# Patient Record
Sex: Male | Born: 1945
Health system: Southern US, Community
[De-identification: ages and names within clinical notes are randomized; demographics above are authoritative.]

## PROBLEM LIST (undated history)

## (undated) DIAGNOSIS — I10 Essential (primary) hypertension: Secondary | ICD-10-CM

## (undated) DIAGNOSIS — I2583 Coronary atherosclerosis due to lipid rich plaque: Secondary | ICD-10-CM

## (undated) DIAGNOSIS — I2510 Atherosclerotic heart disease of native coronary artery without angina pectoris: Secondary | ICD-10-CM

## (undated) DIAGNOSIS — E782 Mixed hyperlipidemia: Secondary | ICD-10-CM

## (undated) HISTORY — DX: Essential (primary) hypertension: I10

## (undated) HISTORY — DX: Atherosclerotic heart disease of native coronary artery without angina pectoris: I25.10

## (undated) HISTORY — DX: Mixed hyperlipidemia: E78.2

## (undated) HISTORY — DX: Coronary atherosclerosis due to lipid rich plaque: I25.83

---

## 2005-06-15 HISTORY — PX: CAROTID STENT: SHX1301

## 2015-08-12 DIAGNOSIS — E7800 Pure hypercholesterolemia, unspecified: Secondary | ICD-10-CM | POA: Diagnosis not present

## 2015-08-12 DIAGNOSIS — I10 Essential (primary) hypertension: Secondary | ICD-10-CM | POA: Diagnosis not present

## 2015-08-12 DIAGNOSIS — E559 Vitamin D deficiency, unspecified: Secondary | ICD-10-CM | POA: Diagnosis not present

## 2015-08-13 DIAGNOSIS — E782 Mixed hyperlipidemia: Secondary | ICD-10-CM | POA: Diagnosis not present

## 2015-08-13 DIAGNOSIS — I10 Essential (primary) hypertension: Secondary | ICD-10-CM | POA: Diagnosis not present

## 2015-08-13 DIAGNOSIS — I2510 Atherosclerotic heart disease of native coronary artery without angina pectoris: Secondary | ICD-10-CM | POA: Diagnosis not present

## 2015-08-16 DIAGNOSIS — I10 Essential (primary) hypertension: Secondary | ICD-10-CM | POA: Diagnosis not present

## 2015-08-16 DIAGNOSIS — Z0000 Encounter for general adult medical examination without abnormal findings: Secondary | ICD-10-CM | POA: Diagnosis not present

## 2015-08-16 DIAGNOSIS — E7800 Pure hypercholesterolemia, unspecified: Secondary | ICD-10-CM | POA: Diagnosis not present

## 2016-01-31 DIAGNOSIS — Z23 Encounter for immunization: Secondary | ICD-10-CM | POA: Diagnosis not present

## 2016-07-30 DIAGNOSIS — E782 Mixed hyperlipidemia: Secondary | ICD-10-CM | POA: Diagnosis not present

## 2016-07-30 DIAGNOSIS — I10 Essential (primary) hypertension: Secondary | ICD-10-CM | POA: Diagnosis not present

## 2016-07-30 DIAGNOSIS — I2510 Atherosclerotic heart disease of native coronary artery without angina pectoris: Secondary | ICD-10-CM | POA: Diagnosis not present

## 2016-07-30 DIAGNOSIS — I2583 Coronary atherosclerosis due to lipid rich plaque: Secondary | ICD-10-CM | POA: Diagnosis not present

## 2016-08-06 DIAGNOSIS — R7301 Impaired fasting glucose: Secondary | ICD-10-CM | POA: Diagnosis not present

## 2016-08-06 DIAGNOSIS — I10 Essential (primary) hypertension: Secondary | ICD-10-CM | POA: Diagnosis not present

## 2016-08-06 DIAGNOSIS — I2510 Atherosclerotic heart disease of native coronary artery without angina pectoris: Secondary | ICD-10-CM | POA: Diagnosis not present

## 2016-08-06 DIAGNOSIS — E782 Mixed hyperlipidemia: Secondary | ICD-10-CM | POA: Diagnosis not present

## 2016-08-06 DIAGNOSIS — I2583 Coronary atherosclerosis due to lipid rich plaque: Secondary | ICD-10-CM | POA: Diagnosis not present

## 2016-09-01 ENCOUNTER — Ambulatory Visit: Payer: Self-pay | Admitting: Physician Assistant

## 2016-09-10 NOTE — Progress Notes (Deleted)
   Cardiology Office Note    Date:  09/10/2016   ID:  James Hall, DOB 17-Oct-1945, MRN [id number]  PCP:  No primary care provider on file.  Cardiologist:  New- will establish with Dr. Anne FuSkains (wife new Dr)  CC: establish care- previously followed in [place].   History of Present Illness:  James Ponsrwin Moorefield is a 71 y.o. male with a history of HTN, HLD, CAD    Past Medical History:  Diagnosis Date  . Atherosclerotic heart disease of native coronary artery without angina pectoris   . Coronary arteriosclerosis due to lipid rich plaque   . Hypertension   . Mixed hyperlipidemia     Past Surgical History:  Procedure Laterality Date  . CAROTID STENT  2007    Current Medications: Outpatient Medications Prior to Visit  Medication Sig Dispense Refill  . aspirin EC 81 MG tablet Take 81 mg by mouth daily.    Marland Kitchen. atorvastatin (LIPITOR) 80 MG tablet Take 40 mg by mouth daily.    . Azilsartan Medoxomil (EDARBI) 80 MG TABS Take 80 mg by mouth daily.    . chlorthalidone (HYGROTON) 25 MG tablet Take 25 mg by mouth daily.    . clopidogrel (PLAVIX) 75 MG tablet Take 75 mg by mouth daily.     No facility-administered medications prior to visit.      Allergies:   Penicillins   Social History   Social History  . Marital status: N/A    Spouse name: N/A  . Number of children: N/A  . Years of education: N/A   Social History Main Topics  . Smoking status: Former Smoker    Types: Cigarettes    Quit date: 09/02/1972  . Smokeless tobacco: Never Used  . Alcohol use Yes     Comment: RARELY  . Drug use: No  . Sexual activity: Not on file   Other Topics Concern  . Not on file   Social History Narrative  . No narrative on file     Family History:  The patient's ***family history includes Other in his father.      *** ROS/PE    Wt Readings from Last 3 Encounters:  No data found for Wt      Studies/Labs Reviewed:   EKG:  EKG is*** ordered today.  The ekg ordered today  demonstrates ***  Recent Labs: No results found for requested labs within last 8760 hours.   Lipid Panel No results found for: CHOL, TRIG, HDL, CHOLHDL, VLDL, LDLCALC, LDLDIRECT  Additional studies/ records that were reviewed today include:  ***   ASSESSMENT & PLAN:       Medication Adjustments/Labs and Tests Ordered: Current medicines are reviewed at length with the patient today.  Concerns regarding medicines are outlined above.  Medication changes, Labs and Tests ordered today are listed in the Patient Instructions below. There are no Patient Instructions on file for this visit.   Signed, Cline CrockKathryn Thompson, PA-C  09/10/2016 1:10 PM    [facility] [address], [place], [place]  [address] Phone: [phone]) [phone]; Fax: [phone]) [phone]

## 2016-09-11 ENCOUNTER — Ambulatory Visit: Payer: Self-pay | Admitting: Physician Assistant

## 2016-09-25 ENCOUNTER — Encounter: Payer: Self-pay | Admitting: Cardiology

## 2016-10-08 ENCOUNTER — Ambulatory Visit: Payer: Self-pay | Admitting: Physician Assistant

## 2016-10-15 ENCOUNTER — Ambulatory Visit (INDEPENDENT_AMBULATORY_CARE_PROVIDER_SITE_OTHER): Payer: Medicare Other | Admitting: Cardiology

## 2016-10-15 ENCOUNTER — Encounter (INDEPENDENT_AMBULATORY_CARE_PROVIDER_SITE_OTHER): Payer: Self-pay

## 2016-10-15 ENCOUNTER — Encounter: Payer: Self-pay | Admitting: Cardiology

## 2016-10-15 VITALS — BP 162/80 | HR 70 | Ht 67.0 in | Wt 184.4 lb

## 2016-10-15 DIAGNOSIS — I10 Essential (primary) hypertension: Secondary | ICD-10-CM | POA: Insufficient documentation

## 2016-10-15 DIAGNOSIS — E7800 Pure hypercholesterolemia, unspecified: Secondary | ICD-10-CM

## 2016-10-15 DIAGNOSIS — I2510 Atherosclerotic heart disease of native coronary artery without angina pectoris: Secondary | ICD-10-CM | POA: Diagnosis not present

## 2016-10-15 MED ORDER — ATORVASTATIN CALCIUM 40 MG PO TABS: 40.0000 mg | ORAL_TABLET | Freq: Every day | ORAL | 3 refills | Status: DC

## 2016-10-15 MED ORDER — METOPROLOL SUCCINATE ER 25 MG PO TB24: 25.0000 mg | ORAL_TABLET | Freq: Every day | ORAL | 3 refills | Status: DC

## 2016-10-15 MED ORDER — CHLORTHALIDONE 25 MG PO TABS: 25.0000 mg | ORAL_TABLET | Freq: Every day | ORAL | 3 refills | Status: DC

## 2016-10-15 NOTE — Patient Instructions (Signed)
Medication Instructions:  Please start Metoprolol 25 mg a day. Continue all other medications as listed.  Follow-Up: Follow up in 6 months with Dr. Anne FuSkains.  You will receive a letter in the mail 2 months before you are due.  Please call us when you receive this letter to schedule your follow up appointment.   If you need a refill on your cardiac medications before your next appointment, please call your pharmacy.  Thank you for choosing [facility]!!

## 2016-10-15 NOTE — Progress Notes (Addendum)
Cardiology Office Note:    Date:  10/15/2016   ID:  James Hall, DOB [birth year], MRN [id number]  PCP:  Rockne Coons, DO  Cardiologist:  Donato Schultz, MD    Referring MD: No ref. provider found     History of Present Illness:    James Hall is a 71 y.o. male with a history of CAD s/p PCI with HTN, HL here to establish care. He had his stent placed in 2007. Has essential hypertension medications reviewed, also hyperlipidemia. He is a former smoker quit 1974. Enjoys yard maintenance, backyard projects.    He has not been having any chest pain, no significant shortness of breath, no fevers chills orthopnea PND syncope. He has been compliant with his medications. He has been on Plavix, likely for early generation drug-eluting stent. We do not have records on Type of stent placed.  Heme 1114.6 platelets 225, creatinine 1.04, potassium 4.1, ALT 40. LDL cholesterol 119, triglycerides 221, HDL 43, total cholesterol 409. Hemoglobin A1c is elevated at 6.5. He had seen Dr. Patty Sermons many many years ago.    Past Medical History:  Diagnosis Date  . Atherosclerotic heart disease of native coronary artery without angina pectoris   . Coronary arteriosclerosis due to lipid rich plaque   . Hypertension   . Mixed hyperlipidemia     Past Surgical History:  Procedure Laterality Date  . CAROTID STENT  2007    Current Medications: Current Meds  Medication Sig  . atorvastatin (LIPITOR) 40 MG tablet Take 1 tablet (40 mg total) by mouth daily.  . Azilsartan Medoxomil (EDARBI) 80 MG TABS Take 80 mg by mouth daily.  . chlorthalidone (HYGROTON) 25 MG tablet Take 1 tablet (25 mg total) by mouth daily.  . clopidogrel (PLAVIX) 75 MG tablet Take 75 mg by mouth daily.  . [DISCONTINUED] atorvastatin (LIPITOR) 80 MG tablet Take 40 mg by mouth daily.  . [DISCONTINUED] chlorthalidone (HYGROTON) 25 MG tablet Take 25 mg by mouth daily.     Allergies:   Penicillins   Social History   Social  History  . Marital status: Married    Spouse name: N/A  . Number of children: N/A  . Years of education: N/A   Social History Main Topics  . Smoking status: Former Smoker    Types: Cigarettes    Quit date: 09/02/1972  . Smokeless tobacco: Never Used  . Alcohol use Yes     Comment: RARELY  . Drug use: No  . Sexual activity: Not Asked   Other Topics Concern  . None   Social History Narrative  . None     Family History: The patient's family history includes Other in his father. Father killed in war, shot in concentration camp.  ROS:   Please see the history of present illness.   No syncope, no fevers, no chills, no orthopnea, no PND.  All other systems reviewed and are negative.  EKGs/Labs/Other Studies Reviewed:    The following studies were reviewed today: Prior office records from Dr. Nedra Hai reviewed. Lab work reviewed, EKG reviewed.  Nuclear stress test 12/01/11 showed normal EF no ischemia  Cardiac catheterization on 05/20/2006-Cypher stent placement 2.75 x 33 in mid left circumflex. Otherwise 50% D1, 50% distal LAD, 90% mid left circumflex, 70% a sterile lateral, 50% distal RCA, 80% PDA, EF 65%.  EKG:  EKG is ordered today.  The ekg ordered today demonstrates sinus rhythm 72 other abnormalities. Personally viewed. Prior EKG from [place], 2013 reviewed sinus rhythm with  no other abnormalities.  Negative stenosis bilateral carotid Dopplers on 12/21/14.  Echocardiogram 12/21/14 showed normal EF normal mitral valve no other abnormalities. Grade 1 diastolic dysfunction.  Recent Labs: No results found for requested labs within last 8760 hours.   Recent Lipid Panel No results found for: CHOL, TRIG, HDL, CHOLHDL, VLDL, LDLCALC, LDLDIRECT  Physical Exam:    VS:  BP (!) 162/80   Pulse 70   Ht 5\' 7"  (1.702 m)   Wt 184 lb 6.4 oz (83.6 kg)   BMI 28.88 kg/m     Wt Readings from Last 3 Encounters:  10/15/16 184 lb 6.4 oz (83.6 kg)     GEN:  Well nourished, well developed  in no acute distress HEENT: Normal NECK: No JVD; No carotid bruits LYMPHATICS: No lymphadenopathy CARDIAC: RRR, no murmurs, rubs, gallops RESPIRATORY:  Clear to auscultation without rales, wheezing or rhonchi  ABDOMEN: Soft, non-tender, non-distended, Protuberant MUSCULOSKELETAL:  No edema; No deformity  SKIN: Warm and dry NEUROLOGIC:  Alert and oriented x 3 PSYCHIATRIC:  Normal affect   ASSESSMENT:    1. Coronary artery disease involving native coronary artery of native heart without angina pectoris   2. Essential hypertension   3. Pure hypercholesterolemia    PLAN:    In order of problems listed above:  Coronary artery disease  - Previous stent placed in 2007. He has been on Plavix. This is likely an early generation drug-eluting stent. [facility] [place]. Plavix alone. No ASA. OK with me.   - I'm comfortable with him continuing with his Plavix. No bruising, no bleeding.  - We will try to get records from [place].   essential hypertension  - Currently mildly elevated on medications above. Dr. Conley RollsLe has been monitoring.  - I will add Toprol 25 mg once a day. He remembers taking a beta blocker once that made him feel sick. We'll trial this once again. He states that at home his blood pressures have been running in the 160 range.  Hyperlipidemia  - Continue with atorvastatin 40 mg-high intensity statin.  We will see him back in 6 months.   Medication Adjustments/Labs and Tests Ordered: Current medicines are reviewed at length with the patient today.  Concerns regarding medicines are outlined above. Labs and tests ordered and medication changes are outlined in the patient instructions below:  Patient Instructions  Medication Instructions:  Please start Metoprolol 25 mg a day. Continue all other medications as listed.  Follow-Up: Follow up in 6 months with Dr. Anne FuSkains.  You will receive a letter in the mail 2 months before you are due.  Please call us when you  receive this letter to schedule your follow up appointment.   If you need a refill on your cardiac medications before your next appointment, please call your pharmacy.  Thank you for choosing [facility]!!        Signed, Donato SchultzMark Skains, MD  10/15/2016 10:35 AM    [facility]

## 2016-12-21 DIAGNOSIS — E782 Mixed hyperlipidemia: Secondary | ICD-10-CM | POA: Diagnosis not present

## 2016-12-21 DIAGNOSIS — I10 Essential (primary) hypertension: Secondary | ICD-10-CM | POA: Diagnosis not present

## 2016-12-21 DIAGNOSIS — E119 Type 2 diabetes mellitus without complications: Secondary | ICD-10-CM | POA: Diagnosis not present

## 2016-12-21 DIAGNOSIS — I2510 Atherosclerotic heart disease of native coronary artery without angina pectoris: Secondary | ICD-10-CM | POA: Diagnosis not present

## 2016-12-21 DIAGNOSIS — Z0000 Encounter for general adult medical examination without abnormal findings: Secondary | ICD-10-CM | POA: Diagnosis not present

## 2017-02-09 DIAGNOSIS — Z23 Encounter for immunization: Secondary | ICD-10-CM | POA: Diagnosis not present

## 2017-02-25 ENCOUNTER — Emergency Department (HOSPITAL_COMMUNITY): Payer: Medicare Other

## 2017-02-25 ENCOUNTER — Inpatient Hospital Stay (HOSPITAL_COMMUNITY)
Admission: EM | Admit: 2017-02-25 | Discharge: 2017-03-02 | DRG: 305 | Disposition: A | Discharge status: Home Health | Payer: Medicare Other | Attending: Internal Medicine | Admitting: Internal Medicine

## 2017-02-25 ENCOUNTER — Encounter (HOSPITAL_COMMUNITY): Payer: Self-pay

## 2017-02-25 ENCOUNTER — Inpatient Hospital Stay (HOSPITAL_COMMUNITY): Payer: Medicare Other

## 2017-02-25 DIAGNOSIS — I674 Hypertensive encephalopathy: Secondary | ICD-10-CM

## 2017-02-25 DIAGNOSIS — I161 Hypertensive emergency: Secondary | ICD-10-CM | POA: Diagnosis not present

## 2017-02-25 DIAGNOSIS — I10 Essential (primary) hypertension: Secondary | ICD-10-CM | POA: Diagnosis not present

## 2017-02-25 DIAGNOSIS — R112 Nausea with vomiting, unspecified: Secondary | ICD-10-CM | POA: Diagnosis not present

## 2017-02-25 DIAGNOSIS — R001 Bradycardia, unspecified: Secondary | ICD-10-CM | POA: Diagnosis present

## 2017-02-25 DIAGNOSIS — Z87891 Personal history of nicotine dependence: Secondary | ICD-10-CM

## 2017-02-25 DIAGNOSIS — E876 Hypokalemia: Secondary | ICD-10-CM | POA: Diagnosis not present

## 2017-02-25 DIAGNOSIS — E782 Mixed hyperlipidemia: Secondary | ICD-10-CM | POA: Diagnosis not present

## 2017-02-25 DIAGNOSIS — R066 Hiccough: Secondary | ICD-10-CM

## 2017-02-25 DIAGNOSIS — R29810 Facial weakness: Secondary | ICD-10-CM | POA: Diagnosis present

## 2017-02-25 DIAGNOSIS — E663 Overweight: Secondary | ICD-10-CM | POA: Diagnosis present

## 2017-02-25 DIAGNOSIS — I2510 Atherosclerotic heart disease of native coronary artery without angina pectoris: Secondary | ICD-10-CM | POA: Diagnosis present

## 2017-02-25 DIAGNOSIS — R269 Unspecified abnormalities of gait and mobility: Secondary | ICD-10-CM | POA: Diagnosis present

## 2017-02-25 DIAGNOSIS — R42 Dizziness and giddiness: Secondary | ICD-10-CM | POA: Diagnosis not present

## 2017-02-25 DIAGNOSIS — Z7902 Long term (current) use of antithrombotics/antiplatelets: Secondary | ICD-10-CM

## 2017-02-25 DIAGNOSIS — Z9861 Coronary angioplasty status: Secondary | ICD-10-CM | POA: Diagnosis not present

## 2017-02-25 DIAGNOSIS — R7303 Prediabetes: Secondary | ICD-10-CM

## 2017-02-25 DIAGNOSIS — J320 Chronic maxillary sinusitis: Secondary | ICD-10-CM | POA: Diagnosis present

## 2017-02-25 DIAGNOSIS — J9600 Acute respiratory failure, unspecified whether with hypoxia or hypercapnia: Secondary | ICD-10-CM | POA: Diagnosis not present

## 2017-02-25 DIAGNOSIS — R109 Unspecified abdominal pain: Secondary | ICD-10-CM | POA: Diagnosis not present

## 2017-02-25 DIAGNOSIS — I6523 Occlusion and stenosis of bilateral carotid arteries: Secondary | ICD-10-CM | POA: Diagnosis not present

## 2017-02-25 DIAGNOSIS — R531 Weakness: Secondary | ICD-10-CM | POA: Diagnosis present

## 2017-02-25 DIAGNOSIS — I2583 Coronary atherosclerosis due to lipid rich plaque: Secondary | ICD-10-CM | POA: Diagnosis present

## 2017-02-25 DIAGNOSIS — Z6829 Body mass index (BMI) 29.0-29.9, adult: Secondary | ICD-10-CM

## 2017-02-25 DIAGNOSIS — Z8673 Personal history of transient ischemic attack (TIA), and cerebral infarction without residual deficits: Secondary | ICD-10-CM | POA: Diagnosis not present

## 2017-02-25 DIAGNOSIS — Z880 Allergy status to penicillin: Secondary | ICD-10-CM | POA: Diagnosis not present

## 2017-02-25 DIAGNOSIS — Z049 Encounter for examination and observation for unspecified reason: Secondary | ICD-10-CM | POA: Diagnosis not present

## 2017-02-25 DIAGNOSIS — E1165 Type 2 diabetes mellitus with hyperglycemia: Secondary | ICD-10-CM | POA: Diagnosis present

## 2017-02-25 DIAGNOSIS — E119 Type 2 diabetes mellitus without complications: Secondary | ICD-10-CM | POA: Diagnosis not present

## 2017-02-25 DIAGNOSIS — D72829 Elevated white blood cell count, unspecified: Secondary | ICD-10-CM | POA: Diagnosis present

## 2017-02-25 DIAGNOSIS — R110 Nausea: Secondary | ICD-10-CM

## 2017-02-25 DIAGNOSIS — R51 Headache: Secondary | ICD-10-CM | POA: Diagnosis not present

## 2017-02-25 LAB — HEPATIC FUNCTION PANEL
ALBUMIN: 4.1 g/dL (ref 3.5–5.0)
ALT: 46 U/L (ref 17–63)
AST: 37 U/L (ref 15–41)
Alkaline Phosphatase: 61 U/L (ref 38–126)
BILIRUBIN TOTAL: 0.9 mg/dL (ref 0.3–1.2)
Bilirubin, Direct: 0.2 mg/dL (ref 0.1–0.5)
Indirect Bilirubin: 0.7 mg/dL (ref 0.3–0.9)
TOTAL PROTEIN: 7.1 g/dL (ref 6.5–8.1)

## 2017-02-25 LAB — BASIC METABOLIC PANEL
ANION GAP: 12 (ref 5–15)
BUN: 13 mg/dL (ref 6–20)
CO2: 21 mmol/L — AB (ref 22–32)
Calcium: 9.4 mg/dL (ref 8.9–10.3)
Chloride: 104 mmol/L (ref 101–111)
Creatinine, Ser: 0.85 mg/dL (ref 0.61–1.24)
GFR calc Af Amer: >60 mL/min (ref >60)
GFR calc non Af Amer: >60 mL/min (ref >60)
GLUCOSE: 157 mg/dL — AB (ref 65–99)
POTASSIUM: 4 mmol/L (ref 3.5–5.1)
Sodium: 137 mmol/L (ref 135–145)

## 2017-02-25 LAB — CBC
HEMATOCRIT: 44 % (ref 39.0–52.0)
HEMOGLOBIN: 14.8 g/dL (ref 13.0–17.0)
MCH: 29.7 pg (ref 26.0–34.0)
MCHC: 33.6 g/dL (ref 30.0–36.0)
MCV: 88.2 fL (ref 78.0–100.0)
Platelets: 219 10*3/uL (ref 150–400)
RBC: 4.99 MIL/uL (ref 4.22–5.81)
RDW: 13.5 % (ref 11.5–15.5)
WBC: 9.3 10*3/uL (ref 4.0–10.5)

## 2017-02-25 LAB — I-STAT TROPONIN, ED: TROPONIN I, POC: 0.01 ng/mL (ref 0.00–0.08)

## 2017-02-25 LAB — GLUCOSE, CAPILLARY: GLUCOSE-CAPILLARY: 174 mg/dL — AB (ref 65–99)

## 2017-02-25 LAB — CBG MONITORING, ED: Glucose-Capillary: 159 mg/dL — ABNORMAL HIGH (ref 65–99)

## 2017-02-25 LAB — ETHANOL: Alcohol, Ethyl (B): <5 mg/dL (ref <5)

## 2017-02-25 LAB — PROTIME-INR
INR: 0.98
Prothrombin Time: 12.9 seconds (ref 11.4–15.2)

## 2017-02-25 LAB — MRSA PCR SCREENING: MRSA by PCR: NEGATIVE

## 2017-02-25 LAB — APTT: APTT: 24 s (ref 24–36)

## 2017-02-25 MED ORDER — SODIUM CHLORIDE 0.9 % IV SOLN: 250.0000 mL | INTRAVENOUS | Status: DC | PRN

## 2017-02-25 MED ORDER — HEPARIN SODIUM (PORCINE) 5000 UNIT/ML IJ SOLN
5000.0000 [IU] | Freq: Three times a day (TID) | INTRAMUSCULAR | Status: DC
  Administered 2017-02-25 – 2017-03-02 (×12): 5000 [IU] via SUBCUTANEOUS
  Filled 2017-02-25 (×17): qty 1

## 2017-02-25 MED ORDER — PANTOPRAZOLE SODIUM 40 MG PO TBEC: 40.0000 mg | DELAYED_RELEASE_TABLET | Freq: Every day | ORAL | Status: DC

## 2017-02-25 MED ORDER — HYDRALAZINE HCL 20 MG/ML IJ SOLN
10.0000 mg | Freq: Once | INTRAMUSCULAR | Status: AC
  Administered 2017-02-25: 10 mg via INTRAVENOUS
  Filled 2017-02-25: qty 1

## 2017-02-25 MED ORDER — ATROPINE SULFATE 1 MG/10ML IJ SOSY
PREFILLED_SYRINGE | INTRAMUSCULAR | Status: AC
  Filled 2017-02-25: qty 10

## 2017-02-25 MED ORDER — NICARDIPINE HCL IN NACL 20-0.86 MG/200ML-% IV SOLN
3.0000 mg/h | INTRAVENOUS | Status: DC
  Administered 2017-02-25: 5 mg/h via INTRAVENOUS
  Administered 2017-02-26: 3 mg/h via INTRAVENOUS
  Filled 2017-02-25 (×3): qty 200

## 2017-02-25 MED ORDER — ONDANSETRON HCL 4 MG/2ML IJ SOLN
4.0000 mg | Freq: Once | INTRAMUSCULAR | Status: AC
  Administered 2017-02-26: 4 mg via INTRAVENOUS

## 2017-02-25 MED ORDER — SODIUM CHLORIDE 0.9 % IV SOLN
INTRAVENOUS | Status: DC
  Administered 2017-02-25 (×2): via INTRAVENOUS
  Administered 2017-02-27: 10 mL/h via INTRAVENOUS

## 2017-02-25 MED ORDER — ONDANSETRON HCL 4 MG/2ML IJ SOLN
4.0000 mg | Freq: Four times a day (QID) | INTRAMUSCULAR | Status: DC | PRN
  Administered 2017-02-25 – 2017-02-27 (×4): 4 mg via INTRAVENOUS
  Filled 2017-02-25 (×6): qty 2

## 2017-02-25 MED ORDER — PANTOPRAZOLE SODIUM 40 MG IV SOLR
40.0000 mg | INTRAVENOUS | Status: DC
  Administered 2017-02-25: 40 mg via INTRAVENOUS
  Filled 2017-02-25: qty 40

## 2017-02-25 NOTE — Consult Note (Signed)
Requesting Physician: [facility]    Chief Complaint: R side weakness, headache  History obtained from:  Patient and Chart   HPI:                                                                                                                                       James Hall is an 71 y.o. male Caucasian right-handed with PMH of hypertension, CAD, hyperlipidemia, past smoker who presents with headache, nausea and right-sided weakness.  Patient states that around 10:30 a.m. noticed develops fairly sudden onset headache characterizes dull, aching about 7 x 10 in intensity. He also felt that his right arm was weak. He checked his blood pressure and was 185 systolic. He thought he would go to sleep and see first if his symptoms improved however after 2-3 hours his symptoms were somewhat better and the patient came to the emergency room. His blood pressure was 220 systolic. Weakness had resolved by the time he was in the hospital and he was not stroke alerted. He was admitted to pulmonary ICU team for hypertensive emergency and started on a nicardipine drip. Neurology was consulted for TIA/hypertensive emergency.  Date last known well: 9.13.2018 Time last known well: 10:30 tPA Given: No outside window, improved symptoms NIHSS 0 Modified Rankin: 0   Past Medical History:  Diagnosis Date  . Atherosclerotic heart disease of native coronary artery without angina pectoris   . Coronary arteriosclerosis due to lipid rich plaque   . Hypertension   . Mixed hyperlipidemia     Past Surgical History:  Procedure Laterality Date  . CAROTID STENT  2007    Family History  Problem Relation Age of Onset  . Other Father        WAR CASUALTY   Social History:  reports that he quit smoking about 44 years ago. His smoking use included Cigarettes. He has never used smokeless tobacco. He reports that he drinks alcohol. He reports that he does not use drugs.  Allergies:  Allergies  Allergen  Reactions  . Penicillins Rash    Has patient had a PCN reaction causing immediate rash, facial/tongue/throat swelling, SOB or lightheadedness with hypotension: No Has patient had a PCN reaction causing severe rash involving mucus membranes or skin necrosis: No Has patient had a PCN reaction that required hospitalization: No Has patient had a PCN reaction occurring within the last 10 years: No If all of the above answers are "NO", then may proceed with Cephalosporin use.    Medications:  Reviewed home medications  ROS:                                                                                                                                        General ROS: negative for - chills, fatigue, fever, night sweats, weight gain or weight loss Psychological ROS: negative for - behavioral disorder, hallucinations, memory difficulties, mood swings or suicidal ideation Ophthalmic ROS: negative for - blurry vision, double vision, eye pain or loss of vision ENT ROS: negative for - epistaxis, nasal discharge, oral lesions, sore throat, tinnitus or vertigo Allergy and Immunology ROS: negative for - hives or itchy/watery eyes Hematological and Lymphatic ROS: negative for - bleeding problems, bruising or swollen lymph nodes Endocrine ROS: negative for - galactorrhea, hair pattern changes, polydipsia/polyuria or temperature intolerance Respiratory ROS: negative for - cough, hemoptysis, shortness of breath or wheezing Cardiovascular ROS: negative for - chest pain, dyspnea on exertion, edema or irregular heartbeat Gastrointestinal ROS: negative for - abdominal pain, diarrhea, hematemesis, nausea/vomiting or stool incontinence Genito-Urinary ROS: negative for - dysuria, hematuria, incontinence or urinary frequency/urgency Musculoskeletal ROS: negative for - joint swelling or muscular  weakness Neurological ROS: as noted in HPI Dermatological ROS: negative for rash and skin lesion changes   Examination:                                                                                                      General: Appears well-developed and well-nourished.  Psych: Affect appropriate to situation Eyes: No scleral injection HENT: No OP obstrucion Head: Normocephalic.  Cardiovascular: Normal rate and regular rhythm.  Respiratory: Effort normal and breath sounds normal to anterior ascultation GI: Soft.  No distension. There is no tenderness.  Skin: WDI   Neurological Examination Mental Status: Alert, oriented, thought content appropriate.  Speech fluent without evidence of aphasia.  Able to follow 3 step commands without difficulty. Cranial Nerves: II: Discs flat bilaterally; Visual fields grossly normal,  III,IV, VI: ptosis not present, extra-ocular motions intact bilaterally, pupils equal, round, reactive to light and accommodation V,VII: smile symmetric, facial light touch sensation normal bilaterally VIII: hearing normal bilaterally IX,X: uvula rises symmetrically XI: bilateral shoulder shrug XII: midline tongue extension Motor: Right : Upper extremity   5/5    Left:     Upper extremity   5/5  Lower extremity   5/5     Lower extremity   5/5 Tone and bulk:normal tone throughout; no atrophy noted Sensory: Pinprick and light touch intact throughout,  bilaterally Deep Tendon Reflexes: 2+ and symmetric throughout Plantars: Right: downgoing   Left: downgoing Cerebellar: normal finger-to-nose, normal rapid alternating movements and normal heel-to-shin test. Mild reduction of coordination in the left arm- likely due to limitation from elbow injury Gait: normal gait and station     Lab Results: Basic Metabolic Panel:  Recent Labs Lab 02/25/17 1647  NA 137  K 4.0  CL 104  CO2 21*  GLUCOSE 157*  BUN 13  CREATININE 0.85  CALCIUM 9.4    CBC:  Recent  Labs Lab 02/25/17 1647  WBC 9.3  HGB 14.8  HCT 44.0  MCV 88.2  PLT 219    Coagulation Studies:  Recent Labs  02/25/17 1647  LABPROT 12.9  INR 0.98    Imaging: Dg Chest 2 View  Result Date: 02/25/2017 CLINICAL DATA:  Patient with weakness.  Arm tingling. EXAM: CHEST  2 VIEW COMPARISON:  None. FINDINGS: Normal cardiac and mediastinal contours. No consolidative pulmonary opacities. No pleural effusion or pneumothorax. Displaced likely old left mid clavicle fracture. Thoracic spine degenerative changes. IMPRESSION: Displace likely old left clavicle fracture. No acute cardiopulmonary process. Electronically Signed   By: Annia Belt M.D.   On: 02/25/2017 17:59   Ct Head Wo Contrast  Result Date: 02/25/2017 CLINICAL DATA:  Dizziness and lightheadedness beginning this morning. EXAM: CT HEAD WITHOUT CONTRAST TECHNIQUE: Contiguous axial images were obtained from the base of the skull through the vertex without intravenous contrast. COMPARISON:  None. FINDINGS: BRAIN: No intraparenchymal hemorrhage, mass effect nor midline shift. The ventricles and sulci are normal for age. Patchy supratentorial white matter hypodensities less than expected for patient's age, though non-specific are most compatible with chronic small vessel ischemic disease. No acute large vascular territory infarcts. No abnormal extra-axial fluid collections. Basal cisterns are patent. VASCULAR: Trace calcific atherosclerosis of the carotid siphons. SKULL: No skull fracture. Old bilateral nasal bone fractures. No significant scalp soft tissue swelling. SINUSES/ORBITS: Chronic LEFT maxillary sinusitis. Old LEFT orbital floor fracture and anterior maxillary wall fracture with cerclage wires. Trace paranasal sinus mucosal thickening. The included ocular globes and orbital contents are non-suspicious. OTHER: None. IMPRESSION: 1. No acute intracranial process. Negative noncontrast CT HEAD for age. 2. Old facial/orbital fractures with  chronic LEFT maxillary sinusitis. Electronically Signed   By: Awilda Metro M.D.   On: 02/25/2017 18:18     ASSESSMENT AND PLAN  71 y.o. male Caucasian right-handed with PMH of hypertension, CAD, hyperlipidemia, past smoker who presents with headache, nausea and right-sided weakness in the setting of severely elevated HTN.    Transient Ischemic Attack/HTN Emergency    Recommend # MRI of the brain without contrast: negative for acute stroke  #Carotid Ultrasound and MRA Head   #Transthoracic Echo  # Start patient on ASA  # BP goal: Reduced by 25 pc over 24 hrs, SBP around 160-180s # HBAIC and Lipid profile # Telemetry monitoring # Frequent neuro checks # NPO until passes stroke swallow screen  Please page stroke NP  Or  PA  Or MD from 8am -4 pm  as this patient from this time will be  followed by the stroke.   You can look them up on [email or web address]  Password [email or web address]

## 2017-02-25 NOTE — H&P (Signed)
Name: James Hall MRN: [id number] DOB: August 24, 1945    ADMISSION DATE:  02/25/2017 CONSULTATION DATE:  02/25/2017  REFERRING MD :  Dr. Rubin Payor  CHIEF COMPLAINT:  Hypertension  HISTORY OF PRESENT ILLNESS:  71 year old male with past medical history as below, which is significant for hypertension, coronary artery disease status post PCI, and hyperlipidemia. Family reports he is typically very healthy. He was in his usual state of health until the morning hours of 9/13 when he initially complains of "feeling weird" in his head, and eventually developed a heaviness and tingling of his right arm compared with a facial droop. He presented to [facility] emergency department with these complaints and was subsequent found to be profound hypertensive. His highest blood pressure measurement was 248/79. He was given a one-time dose of IV hydralazine and attempts to lower his blood pressure. He underwent CT scan, which was negative. His hypotension was refractory to IV hydralazine and PCCM was asked to see for further evaluation.  SIGNIFICANT EVENTS  9/13 admit to ICU on a Cardizem infusion  STUDIES:  CT head 9/13> no acute endocrine or process. Negative CT head for age. Old facial/orbital fractures with chronic left maxillary sinusitis. MRI brain 9/14>>>   PAST MEDICAL HISTORY :   has a past medical history of Atherosclerotic heart disease of native coronary artery without angina pectoris; Coronary arteriosclerosis due to lipid rich plaque; Hypertension; and Mixed hyperlipidemia.  has a past surgical history that includes Carotid stent (2007). Prior to Admission medications   Medication Sig Start Date End Date Taking? Authorizing Provider  atorvastatin (LIPITOR) 40 MG tablet Take 1 tablet (40 mg total) by mouth daily. 10/15/16  Yes Jake Bathe, MD  chlorthalidone (HYGROTON) 25 MG tablet Take 1 tablet (25 mg total) by mouth daily. 10/15/16  Yes Jake Bathe, MD  clopidogrel (PLAVIX) 75 MG  tablet Take 75 mg by mouth daily.   Yes [provider]  metoprolol succinate (TOPROL-XL) 25 MG 24 hr tablet Take 1 tablet (25 mg total) by mouth daily. 10/15/16  Yes Jake Bathe, MD  vitamin B-12 (CYANOCOBALAMIN) 100 MCG tablet Take 100 mcg by mouth daily. Gummie   Yes [provider]   Allergies  Allergen Reactions  . Penicillins Rash    Has patient had a PCN reaction causing immediate rash, facial/tongue/throat swelling, SOB or lightheadedness with hypotension: No Has patient had a PCN reaction causing severe rash involving mucus membranes or skin necrosis: No Has patient had a PCN reaction that required hospitalization: No Has patient had a PCN reaction occurring within the last 10 years: No If all of the above answers are "NO", then may proceed with Cephalosporin use.    FAMILY HISTORY:  family history includes Other in his father. SOCIAL HISTORY:  reports that he quit smoking about 44 years ago. His smoking use included Cigarettes. He has never used smokeless tobacco. He reports that he drinks alcohol. He reports that he does not use drugs.  REVIEW OF SYSTEMS:   Bolds are positive  Constitutional: weight loss, gain, night sweats, Fevers, chills, fatigue .  HEENT: headaches, Sore throat, sneezing, nasal congestion, post nasal drip, Difficulty swallowing, Tooth/dental problems, visual complaints visual changes, ear ache CV:  chest pain, radiates,Orthopnea, PND, swelling in lower extremities, dizziness, palpitations, syncope.  GI  heartburn, indigestion, abdominal pain, nausea, vomiting, diarrhea, change in bowel habits, loss of appetite, bloody stools.  Resp: cough, productive:, hemoptysis, dyspnea, chest pain, pleuritic.  Skin: rash or itching or icterus GU:  dysuria, change in color of urine, urgency or frequency. flank pain, hematuria  MS: joint pain or swelling. decreased range of motion R arm heaviness, tingling Psych: change in mood or affect. depression or  anxiety.  Neuro: difficulty with speech, weakness - R arm , numbness, ataxia    SUBJECTIVE:   VITAL SIGNS: Temp:  [97.7 F (36.5 C)-98 F (36.7 C)] 98 F (36.7 C) (09/13 1824) Pulse Rate:  [47-55] 51 (09/13 1900) Resp:  [10-17] 14 (09/13 1900) BP: (206-248)/(73-86) 206/73 (09/13 1900) SpO2:  [95 %-98 %] 97 % (09/13 1900) Weight:  [83.9 kg (185 lb)] 83.9 kg (185 lb) (09/13 1635)  PHYSICAL EXAMINATION: General:  Overweight elderly male in no acute distress Neuro:  Alert, oriented. Mild facial droop, right arm mildly weak 4/5 HEENT:  Normocephalic atraumatic, PERRL, no JVD Cardiovascular:  Bradycardic, regular, no MRG Lungs:  Clear bilateral breath sounds Abdomen:  Soft, nontender, nondistended Musculoskeletal:  No acute deformity Skin:  Grossly intact   Recent Labs Lab 02/25/17 1647  NA 137  K 4.0  CL 104  CO2 21*  BUN 13  CREATININE 0.85  GLUCOSE 157*    Recent Labs Lab 02/25/17 1647  HGB 14.8  HCT 44.0  WBC 9.3  PLT 219   Dg Chest 2 View  Result Date: 02/25/2017 CLINICAL DATA:  Patient with weakness.  Arm tingling. EXAM: CHEST  2 VIEW COMPARISON:  None. FINDINGS: Normal cardiac and mediastinal contours. No consolidative pulmonary opacities. No pleural effusion or pneumothorax. Displaced likely old left mid clavicle fracture. Thoracic spine degenerative changes. IMPRESSION: Displace likely old left clavicle fracture. No acute cardiopulmonary process. Electronically Signed   By: Annia Beltrew  Davis M.D.   On: 02/25/2017 17:59   Ct Head Wo Contrast  Result Date: 02/25/2017 CLINICAL DATA:  Dizziness and lightheadedness beginning this morning. EXAM: CT HEAD WITHOUT CONTRAST TECHNIQUE: Contiguous axial images were obtained from the base of the skull through the vertex without intravenous contrast. COMPARISON:  None. FINDINGS: BRAIN: No intraparenchymal hemorrhage, mass effect nor midline shift. The ventricles and sulci are normal for age. Patchy supratentorial white matter  hypodensities less than expected for patient's age, though non-specific are most compatible with chronic small vessel ischemic disease. No acute large vascular territory infarcts. No abnormal extra-axial fluid collections. Basal cisterns are patent. VASCULAR: Trace calcific atherosclerosis of the carotid siphons. SKULL: No skull fracture. Old bilateral nasal bone fractures. No significant scalp soft tissue swelling. SINUSES/ORBITS: Chronic LEFT maxillary sinusitis. Old LEFT orbital floor fracture and anterior maxillary wall fracture with cerclage wires. Trace paranasal sinus mucosal thickening. The included ocular globes and orbital contents are non-suspicious. OTHER: None. IMPRESSION: 1. No acute intracranial process. Negative noncontrast CT HEAD for age. 2. Old facial/orbital fractures with chronic LEFT maxillary sinusitis. Electronically Signed   By: Awilda Metroourtnay  Bloomer M.D.   On: 02/25/2017 18:18    ASSESSMENT / PLAN:  Hypertensive emergency - with associated neurologic deficits - Hemodynamic monitoring in the ICU setting - Nicardipine infusion with systolic blood pressure goal 175-195 mmHg targeting a 25% percent reduction - When necessary hydralazine - MRI brain pending - Trend troponin - Echocardiogram - Considering neurology consultation - We'll hold home metoprolol, chlorthalidone for now given bradycardia  Nausea/vomiting: Likely in setting of hypertensive crisis - Nothing by mouth for tonight - When necessary Zofran - Protonix for stress ulcer prophylaxis  Coronary artery disease - Continue home Plavix, atorvastatin  Hyperglycemia with no history of DM.  - monitor  Diet: NPO SUP: Protonix VTE: SCDs, early  ambulation Dispo: ICU Code: Full   Joneen Roach, AGACNP-BC [facility] Pulmonology/Critical Care Pager [phone] or [phone]  02/25/2017 7:50 PM

## 2017-02-25 NOTE — ED Triage Notes (Signed)
Pt from home with ems c.o dizziness and nausea that started at 1030am today. Denies CP, hx of cardiac stents, HTN. Pt hypertensive 219/86 HR 50s sinus. Pt alert/orientedx4, 18 R wrist, 4 zofran given en route. Denies nausea at this time. CBG 160 Pt still feeling "off balance" more so than dizzy

## 2017-02-25 NOTE — Progress Notes (Signed)
   HR 40s - looks sinus on monitor. Per RN had transient dip t 20s  Plan ekg  Dr. Kalman ShanMurali Madissen Wyse, M.D., [facility] Pulmonary and Critical Care Medicine Staff Physician [facility] [facility] [place] Pulmonary and Critical Care Pager: [phone], If no answer or between  15:00h - 7:00h: call [phone]  02/25/2017 10:24 PM

## 2017-02-25 NOTE — Progress Notes (Signed)
Changed protonix from po to IV  Dr. Kalman ShanMurali Aijah Lattner, M.D., [facility] Pulmonary and Critical Care Medicine Staff Physician [place] [facility] [place] Pulmonary and Critical Care Pager: [phone], If no answer or between  15:00h - 7:00h: call [phone]  02/25/2017 9:25 PM

## 2017-02-25 NOTE — ED Provider Notes (Signed)
[facility]-EMERGENCY DEPT Provider Note   CSN: [id number] Arrival date & time: 02/25/17  1609     History   Chief Complaint Chief Complaint  Patient presents with  . Dizziness  . Nausea    HPI James Hall is a 71 y.o. male.  HPI Patient presents with dizziness and slight nausea. States he feels off-balance. States he feels, like he is drunk. No headache. No confusion. Began at 10:30 today while he was playing cards. States he has had TIAs in the past. History of hypertension and his blood pressure is high today he states he normally does not have high blood pressure. No lateralizing numbness or weakness. No vision changes. Past Medical History:  Diagnosis Date  . Atherosclerotic heart disease of native coronary artery without angina pectoris   . Coronary arteriosclerosis due to lipid rich plaque   . Hypertension   . Mixed hyperlipidemia     Patient Active Problem List   Diagnosis Date Noted  . Hypertensive emergency 02/25/2017  . Coronary artery disease involving native coronary artery of native heart without angina pectoris 10/15/2016  . Essential hypertension 10/15/2016  . Pure hypercholesterolemia 10/15/2016    Past Surgical History:  Procedure Laterality Date  . CAROTID STENT  2007       Home Medications    Prior to Admission medications   Medication Sig Start Date End Date Taking? Authorizing Provider  atorvastatin (LIPITOR) 40 MG tablet Take 1 tablet (40 mg total) by mouth daily. 10/15/16  Yes Jake Bathe, MD  chlorthalidone (HYGROTON) 25 MG tablet Take 1 tablet (25 mg total) by mouth daily. 10/15/16  Yes Jake Bathe, MD  clopidogrel (PLAVIX) 75 MG tablet Take 75 mg by mouth daily.   Yes [provider]  metoprolol succinate (TOPROL-XL) 25 MG 24 hr tablet Take 1 tablet (25 mg total) by mouth daily. 10/15/16  Yes Jake Bathe, MD  vitamin B-12 (CYANOCOBALAMIN) 100 MCG tablet Take 100 mcg by mouth daily. Gummie   Yes [provider]     Family History Family History  Problem Relation Age of Onset  . Other Father        WAR CASUALTY    Social History Social History  Substance Use Topics  . Smoking status: Former Smoker    Types: Cigarettes    Quit date: 09/02/1972  . Smokeless tobacco: Never Used  . Alcohol use Yes     Comment: RARELY     Allergies   Penicillins   Review of Systems Review of Systems   Physical Exam Updated Vital Signs BP (!) 206/73   Pulse (!) 51   Temp 98 F (36.7 C) (Oral)   Resp 14   Ht  (1.702 m)   Wt 83.9 kg (185 lb)   SpO2 97%   BMI 28.98 kg/m   Physical Exam   ED Treatments / Results  Labs (all labs ordered are listed, but only abnormal results are displayed) Labs Reviewed  BASIC METABOLIC PANEL - Abnormal; Notable for the following:       Result Value   CO2 21 (*)    Glucose, Bld 157 (*)    All other components within normal limits  CBG MONITORING, ED - Abnormal; Notable for the following:    Glucose-Capillary 159 (*)    All other components within normal limits  CBC  ETHANOL  PROTIME-INR  APTT  HEPATIC FUNCTION PANEL  RAPID URINE DRUG SCREEN, HOSP PERFORMED  URINALYSIS, ROUTINE W REFLEX MICROSCOPIC  TROPONIN  I  TROPONIN I  CBC  BASIC METABOLIC PANEL  MAGNESIUM  PHOSPHORUS  I-STAT TROPONIN, ED    EKG  EKG Interpretation  Date/Time:  Thursday February 25 2017 18:16:31 EDT Ventricular Rate:  53 PR Interval:    QRS Duration: 115 QT Interval:  507 QTC Calculation: 477 R Axis:   39 Text Interpretation:  Sinus rhythm Incomplete right bundle branch block Confirmed by Benjiman Core [phone]) on 02/25/2017 6:19:01 PM       Radiology Dg Chest 2 View  Result Date: 02/25/2017 CLINICAL DATA:  Patient with weakness.  Arm tingling. EXAM: CHEST  2 VIEW COMPARISON:  None. FINDINGS: Normal cardiac and mediastinal contours. No consolidative pulmonary opacities. No pleural effusion or pneumothorax. Displaced likely old left mid clavicle  fracture. Thoracic spine degenerative changes. IMPRESSION: Displace likely old left clavicle fracture. No acute cardiopulmonary process. Electronically Signed   By: Annia Belt M.D.   On: 02/25/2017 17:59   Ct Head Wo Contrast  Result Date: 02/25/2017 CLINICAL DATA:  Dizziness and lightheadedness beginning this morning. EXAM: CT HEAD WITHOUT CONTRAST TECHNIQUE: Contiguous axial images were obtained from the base of the skull through the vertex without intravenous contrast. COMPARISON:  None. FINDINGS: BRAIN: No intraparenchymal hemorrhage, mass effect nor midline shift. The ventricles and sulci are normal for age. Patchy supratentorial white matter hypodensities less than expected for patient's age, though non-specific are most compatible with chronic small vessel ischemic disease. No acute large vascular territory infarcts. No abnormal extra-axial fluid collections. Basal cisterns are patent. VASCULAR: Trace calcific atherosclerosis of the carotid siphons. SKULL: No skull fracture. Old bilateral nasal bone fractures. No significant scalp soft tissue swelling. SINUSES/ORBITS: Chronic LEFT maxillary sinusitis. Old LEFT orbital floor fracture and anterior maxillary wall fracture with cerclage wires. Trace paranasal sinus mucosal thickening. The included ocular globes and orbital contents are non-suspicious. OTHER: None. IMPRESSION: 1. No acute intracranial process. Negative noncontrast CT HEAD for age. 2. Old facial/orbital fractures with chronic LEFT maxillary sinusitis. Electronically Signed   By: Awilda Metro M.D.   On: 02/25/2017 18:18    Procedures Procedures (including critical care time)  Medications Ordered in ED Medications  nicardipine (CARDENE)  in 0.86% saline IV infusion (0.1 mg/ml) (5 mg/hr Intravenous New Bag/Given 02/25/17 1953)  ondansetron (ZOFRAN) injection 4 mg (4 mg Intravenous Given 02/25/17 1946)  0.9 %  sodium chloride infusion (not administered)  heparin injection  5,000 Units (not administered)  0.9 %  sodium chloride infusion (not administered)  pantoprazole (PROTONIX) EC tablet 40 mg (not administered)  hydrALAZINE (APRESOLINE) injection 10 mg (10 mg Intravenous Given 02/25/17 1851)     Initial Impression / Assessment and Plan / ED Course  I have reviewed the triage vital signs and the nursing notes.  Pertinent labs & imaging results that were available during my care of the patient were reviewed by me and considered in my medical decision making (see chart for details).     Patient presented with dizziness nausea and later a headache. Has some unsteadiness and left eye lid droop. Not a TPA candidate due to severity of deficits. Head CT reassuring. He is however bradycardic and somewhat severely hypertensive. Could be hypertensive emergency causing the deficits. MRI ordered. However started on IV antihypertensives. Admit to ICU.  CRITICAL CARE Performed by: Billee Cashing Total critical care time: 30 minutes Critical care time was exclusive of separately billable procedures and treating other patients. Critical care was necessary to treat or prevent imminent or life-threatening deterioration. Critical care was  time spent personally by me on the following activities: development of treatment plan with patient and/or surrogate as well as nursing, discussions with consultants, evaluation of patient's response to treatment, examination of patient, obtaining history from patient or surrogate, ordering and performing treatments and interventions, ordering and review of laboratory studies, ordering and review of radiographic studies, pulse oximetry and re-evaluation of patient's condition.  Final Clinical Impressions(s) / ED Diagnoses   Final diagnoses:  Hypertensive emergency    New Prescriptions New Prescriptions   No medications on file     Benjiman CorePickering, Danell Vazquez, MD 02/25/17 2023

## 2017-02-25 NOTE — ED Notes (Signed)
Pt in CT.

## 2017-02-26 ENCOUNTER — Inpatient Hospital Stay (HOSPITAL_COMMUNITY): Payer: Medicare Other

## 2017-02-26 ENCOUNTER — Encounter (HOSPITAL_COMMUNITY): Payer: Self-pay | Admitting: Radiology

## 2017-02-26 DIAGNOSIS — I674 Hypertensive encephalopathy: Secondary | ICD-10-CM

## 2017-02-26 DIAGNOSIS — R7303 Prediabetes: Secondary | ICD-10-CM

## 2017-02-26 DIAGNOSIS — I161 Hypertensive emergency: Secondary | ICD-10-CM

## 2017-02-26 DIAGNOSIS — D72829 Elevated white blood cell count, unspecified: Secondary | ICD-10-CM

## 2017-02-26 DIAGNOSIS — E782 Mixed hyperlipidemia: Secondary | ICD-10-CM

## 2017-02-26 LAB — BASIC METABOLIC PANEL
ANION GAP: 11 (ref 5–15)
BUN: 10 mg/dL (ref 6–20)
CALCIUM: 9 mg/dL (ref 8.9–10.3)
CHLORIDE: 101 mmol/L (ref 101–111)
CO2: 25 mmol/L (ref 22–32)
CREATININE: 0.81 mg/dL (ref 0.61–1.24)
GFR calc non Af Amer: >60 mL/min (ref >60)
Glucose, Bld: 165 mg/dL — ABNORMAL HIGH (ref 65–99)
Potassium: 3.4 mmol/L — ABNORMAL LOW (ref 3.5–5.1)
SODIUM: 137 mmol/L (ref 135–145)

## 2017-02-26 LAB — LIPID PANEL
CHOL/HDL RATIO: 5.6 ratio
Cholesterol: 235 mg/dL — ABNORMAL HIGH (ref 0–200)
HDL: 42 mg/dL (ref >40)
LDL CALC: 149 mg/dL — AB (ref 0–99)
TRIGLYCERIDES: 218 mg/dL — AB (ref <150)
VLDL: 44 mg/dL — AB (ref 0–40)

## 2017-02-26 LAB — MAGNESIUM: MAGNESIUM: 1.5 mg/dL — AB (ref 1.7–2.4)

## 2017-02-26 LAB — CBC
HEMATOCRIT: 44.2 % (ref 39.0–52.0)
HEMOGLOBIN: 14.7 g/dL (ref 13.0–17.0)
MCH: 28.9 pg (ref 26.0–34.0)
MCHC: 33.3 g/dL (ref 30.0–36.0)
MCV: 86.8 fL (ref 78.0–100.0)
Platelets: 252 10*3/uL (ref 150–400)
RBC: 5.09 MIL/uL (ref 4.22–5.81)
RDW: 13.3 % (ref 11.5–15.5)
WBC: 13.3 10*3/uL — ABNORMAL HIGH (ref 4.0–10.5)

## 2017-02-26 LAB — TROPONIN I: Troponin I: <0.03 ng/mL (ref <0.03)

## 2017-02-26 LAB — HEMOGLOBIN A1C
HEMOGLOBIN A1C: 6.5 % — AB (ref 4.8–5.6)
Mean Plasma Glucose: 139.85 mg/dL

## 2017-02-26 LAB — PHOSPHORUS: PHOSPHORUS: 3.3 mg/dL (ref 2.5–4.6)

## 2017-02-26 LAB — ECHOCARDIOGRAM COMPLETE
Height: 67 in
Weight: 3030 oz

## 2017-02-26 MED ORDER — POTASSIUM CHLORIDE 10 MEQ/100ML IV SOLN: 10.0000 meq | INTRAVENOUS | Status: AC

## 2017-02-26 MED ORDER — CLOPIDOGREL BISULFATE 75 MG PO TABS
75.0000 mg | ORAL_TABLET | Freq: Every day | ORAL | Status: DC
  Administered 2017-02-26 – 2017-03-02 (×5): 75 mg via ORAL
  Filled 2017-02-26 (×5): qty 1

## 2017-02-26 MED ORDER — IOPAMIDOL (ISOVUE-370) INJECTION 76%
INTRAVENOUS | Status: AC
  Administered 2017-02-26: 50 mL
  Filled 2017-02-26: qty 50

## 2017-02-26 MED ORDER — ATORVASTATIN CALCIUM 40 MG PO TABS
40.0000 mg | ORAL_TABLET | Freq: Every day | ORAL | Status: DC
  Filled 2017-02-26: qty 1

## 2017-02-26 MED ORDER — ATORVASTATIN CALCIUM 80 MG PO TABS
80.0000 mg | ORAL_TABLET | Freq: Every day | ORAL | Status: DC
  Administered 2017-02-27 – 2017-03-01 (×3): 80 mg via ORAL
  Filled 2017-02-26 (×5): qty 1

## 2017-02-26 MED ORDER — CHLORTHALIDONE 25 MG PO TABS
25.0000 mg | ORAL_TABLET | Freq: Every day | ORAL | Status: DC
  Administered 2017-02-26 – 2017-03-02 (×5): 25 mg via ORAL
  Filled 2017-02-26 (×5): qty 1

## 2017-02-26 MED ORDER — MECLIZINE HCL 25 MG PO TABS
25.0000 mg | ORAL_TABLET | Freq: Two times a day (BID) | ORAL | Status: DC | PRN
  Administered 2017-02-26: 25 mg via ORAL
  Filled 2017-02-26 (×3): qty 1

## 2017-02-26 MED ORDER — LISINOPRIL 10 MG PO TABS
10.0000 mg | ORAL_TABLET | Freq: Every day | ORAL | Status: DC
  Administered 2017-02-26 – 2017-03-02 (×5): 10 mg via ORAL
  Filled 2017-02-26 (×5): qty 1

## 2017-02-26 MED ORDER — MAGNESIUM SULFATE 2 GM/50ML IV SOLN
2.0000 g | Freq: Once | INTRAVENOUS | Status: AC
  Administered 2017-02-26: 2 g via INTRAVENOUS
  Filled 2017-02-26: qty 50

## 2017-02-26 MED ORDER — HYDRALAZINE HCL 20 MG/ML IJ SOLN
10.0000 mg | INTRAMUSCULAR | Status: DC | PRN
  Administered 2017-02-26 – 2017-02-28 (×5): 10 mg via INTRAVENOUS
  Filled 2017-02-26 (×6): qty 1

## 2017-02-26 NOTE — Progress Notes (Signed)
  Echocardiogram 2D Echocardiogram has been performed.  Laverda Stribling T Onetta Spainhower 02/26/2017, 8:31 AM

## 2017-02-26 NOTE — Progress Notes (Signed)
STROKE TEAM PROGRESS NOTE   HISTORY OF PRESENT ILLNESS (per record) James Hall is an 71 y.o. male Caucasian right-handed with PMH of hypertension, CAD, hyperlipidemia, past smoker who presents with headache, nausea and right-sided weakness.  Patient states that around 10:30 a.m. noticed develops fairly sudden onset headache characterizes dull, aching about 7 x 10 in intensity. He also felt that his right arm was weak. He checked his blood pressure and was 185 systolic. He thought he would go to sleep and see first if his symptoms improved however after 2-3 hours his symptoms were somewhat better and the patient came to the emergency room. His blood pressure was 220 systolic. Weakness had resolved by the time he was in the hospital and he was not stroke alerted. He was admitted to pulmonary ICU team for hypertensive emergency and started on a nicardipine drip. Neurology was consulted for TIA/hypertensive emergency.  Date last known well: 9.13.2018 Time last known well: 10:30 tPA Given: No outside window, improved symptoms NIHSS 0 Modified Rankin: 0  SUBJECTIVE (INTERVAL HISTORY) His RN is at the bedside.  Pt still on cardene for BP management. Pt still complains of lightheadedness, slight HA which has subsided. Kept NPO overnight but passed swallow screen yesterday. Pt wants to eat something otherwise will throw up.    OBJECTIVE Temp:  [97.7 F (36.5 C)-98 F (36.7 C)] 97.9 F (36.6 C) (09/14 0731) Pulse Rate:  [47-74] 61 (09/14 0700) Cardiac Rhythm: Normal sinus rhythm;Sinus bradycardia (09/14 0400) Resp:  [8-20] 15 (09/14 0700) BP: (102-248)/(44-86) 166/72 (09/14 0700) SpO2:  [90 %-98 %] 96 % (09/14 0700) Weight:  [83.9 kg (185 lb)-85.9 kg (189 lb 6 oz)] 85.9 kg (189 lb 6 oz) (09/14 0448)  CBC:  Recent Labs Lab 02/25/17 1647 02/26/17 0519  WBC 9.3 13.3*  HGB 14.8 14.7  HCT 44.0 44.2  MCV 88.2 86.8  PLT 219 252    Basic Metabolic Panel:  Recent Labs Lab  02/25/17 1647 02/26/17 0519  NA 137 137  K 4.0 3.4*  CL 104 101  CO2 21* 25  GLUCOSE 157* 165*  BUN 13 10  CREATININE 0.85 0.81  CALCIUM 9.4 9.0  MG  --  1.5*  PHOS  --  3.3    Lipid Panel: No results found for: CHOL, TRIG, HDL, CHOLHDL, VLDL, LDLCALC HgbA1c:  Lab Results  Component Value Date   HGBA1C 6.5 (H) 02/26/2017   Urine Drug Screen: No results found for: LABOPIA, COCAINSCRNUR, LABBENZ, AMPHETMU, THCU, LABBARB  Alcohol Level     Component Value Date/Time   ETH <5 02/25/2017 1647    IMAGING I have personally reviewed the radiological images below and agree with the radiology interpretations.  Ct Head Wo Contrast 02/25/2017 IMPRESSION: 1. No acute intracranial process. Negative noncontrast CT HEAD for age. 2. Old facial/orbital fractures with chronic LEFT maxillary sinusitis.    Mr Brain Wo Contrast 02/26/2017 IMPRESSION: 1. No acute intracranial abnormality. 2. Mild chronic microvascular ischemic changes and mild parenchymal volume loss of the brain. 3. Chronic left orbital and zygomatic complex fracture as well as left maxillary sinus mucosal thickening as seen on prior CT.   TTE   - Left ventricle: The cavity size was normal. There was mild   concentric hypertrophy. Systolic function was normal. The   estimated ejection fraction was in the range of 60% to 65%. Wall   motion was normal; there were no regional wall motion   abnormalities. Doppler parameters are consistent with abnormal   left ventricular  relaxation (grade 1 diastolic dysfunction).   Doppler parameters are consistent with indeterminate ventricular   filling pressure. - Aortic valve: Transvalvular velocity was within the normal range.   There was no stenosis. There was no regurgitation. - Mitral valve: Transvalvular velocity was within the normal range.   There was no evidence for stenosis. There was no regurgitation. - Right ventricle: The cavity size was normal. Wall thickness was   normal.  Systolic function was normal. - Atrial septum: No defect or patent foramen ovale was identified. - Tricuspid valve: There was no regurgitation.  CTA head and neck  pending   PHYSICAL EXAM  Temp:  [97.9 F (36.6 C)-98.4 F (36.9 C)] 98.4 F (36.9 C) (09/14 1615) Pulse Rate:  [47-74] 62 (09/14 1500) Resp:  [8-20] 13 (09/14 1400) BP: (102-248)/(44-98) 192/61 (09/14 1500) SpO2:  [90 %-98 %] 94 % (09/14 1500) Weight:  [185 lb (83.9 kg)-189 lb 6 oz (85.9 kg)] 189 lb 6 oz (85.9 kg) (09/14 0448)  General - Well nourished, well developed, in no apparent distress.  Ophthalmologic - Fundi not visualized due to small pupils.  Cardiovascular - Regular rate and rhythm.  Mental Status -  Level of arousal and orientation to time, place, and person were intact. Language including expression, naming, repetition, comprehension was assessed and found intact. Attention span and concentration were normal. Fund of Knowledge was assessed and was intact.  Cranial Nerves II - XII - II - Visual field intact OU. III, IV, VI - Extraocular movements intact. Left eye slight ptosis at rest, however, able to raise up eyelid equally bilaterally. Pt driver's license photo is not clear enough to see. V - Facial sensation intact bilaterally.  VII - Facial movement intact bilaterally. VIII - Hearing & vestibular intact bilaterally. X - Palate elevates symmetrically. XI - Chin turning & shoulder shrug intact bilaterally. XII - Tongue protrusion intact.  Motor Strength - The patient's strength was normal in all extremities and pronator drift was absent.  Bulk was normal and fasciculations were absent.   Motor Tone - Muscle tone was assessed at the neck and appendages and was normal.  Reflexes - The patient's reflexes were 1+ in all extremities and he had no pathological reflexes.  Sensory - Light touch, temperature/pinprick were assessed and were symmetrical.    Coordination - The patient had normal  movements in the hands and feet with no ataxia or dysmetria.  Tremor was absent.  Gait and Station - deferred.   ASSESSMENT/PLAN James Hall is a 71 y.o. male with history of CAD, HTN, HLD presenting with headache, nausea vomiting, transient generalized weakness, right seems weaker than the left. He did not receive IV t-PA due to out of window, improved symptoms.   Hypertensive emergency / encephalopathy  Resultant  lightheadedness, nauseous  CT head no acute abnormality  MRI head no acute stroke  CTA head and neck pending  2D Echo  EF 60-65%  LDL 149  HgbA1c 6.5  SCDs for VTE prophylaxis  Diet NPO time specified  clopidogrel 75 mg daily prior to admission, now on clopidogrel 75 mg daily   Meclizine when necessary  Patient counseled to be compliant with his antithrombotic medications  Ongoing aggressive stroke risk factor management  Therapy recommendations:  Pending  Disposition:  Pending  Hypertension  Stable  On Cardene  BP management as per primary team  Long-term BP goal normotensive  Hyperlipidemia  Home meds:  Lipitor 40, resumed in hospital  LDL 149, goal < 70  Increase Lipitor to 80  Continue statin at discharge  Diabetes  HgbA1c 6.5, goal < 7.0  Hyperglycemia  Close follow-up with PCP  Other Stroke Risk Factors  Advanced age  Former smoker, quit 44 years ago  ETOH use, advised to drink no more than 2 drink(s) a day  Other Active Problems  Elevated triglyceride  Leukocytosis  Hospital day # 1  This patient is critically ill due to hypertensive emergency, hypertensive encephalopathy and at significant risk of neurological worsening, death form stroke, ICH, seizure, PRES, heart failure. This patient's care requires constant monitoring of vital signs, hemodynamics, respiratory and cardiac monitoring, review of multiple databases, neurological assessment, discussion with family, other specialists and medical decision  making of high complexity. I spent 35 minutes of neurocritical care time in the care of this patient.  Neurology will sign off. Please call with questions. No neuro follow-up needed at this time. Thanks for the consult.  Marvel Plan, MD PhD Stroke Neurology 02/26/2017 4:46 PM   To contact Stroke Continuity provider, please refer to [email or web address]. After hours, contact General Neurology

## 2017-02-26 NOTE — Progress Notes (Signed)
Name: Bettie Swavely MRN: [id number] DOB: 18-Nov-1945    ADMISSION DATE:  02/25/2017 CONSULTATION DATE:  02/25/2017  REFERRING MD :  Dr. Rubin Payor  CHIEF COMPLAINT:  Hypertension  HISTORY OF PRESENT ILLNESS:  71 year old male with past medical history as below, which is significant for hypertension, coronary artery disease status post PCI, and hyperlipidemia. Family reports he is typically very healthy. He was in his usual state of health until the morning hours of 9/13 when he initially complains of "feeling weird" in his head, and eventually developed a heaviness and tingling of his right arm compared with a facial droop. He presented to [facility] emergency department with these complaints and was subsequent found to be profound hypertensive. His highest blood pressure measurement was 248/79. He was given a one-time dose of IV hydralazine and attempts to lower his blood pressure. He underwent CT scan, which was negative. His hypotension was refractory to IV hydralazine and PCCM was asked to see for further evaluation.  SIGNIFICANT EVENTS  9/13 admit to ICU on a Cardizem infusion 9/14 >> off cardizem drip  STUDIES:  CT head 9/13> no acute endocrine or process. Negative CT head for age. Old facial/orbital fractures with chronic left maxillary sinusitis. MRI brain 9/14>>> No acute process Echo 9/14>>  PAST MEDICAL HISTORY :   has a past medical history of Atherosclerotic heart disease of native coronary artery without angina pectoris; Coronary arteriosclerosis due to lipid rich plaque; Hypertension; and Mixed hyperlipidemia.  has a past surgical history that includes Carotid stent (2007). Prior to Admission medications   Medication Sig Start Date End Date Taking? Authorizing Provider  atorvastatin (LIPITOR) 40 MG tablet Take 1 tablet (40 mg total) by mouth daily. 10/15/16  Yes Jake Bathe, MD  chlorthalidone (HYGROTON) 25 MG tablet Take 1 tablet (25 mg total) by mouth daily. 10/15/16   Yes Jake Bathe, MD  clopidogrel (PLAVIX) 75 MG tablet Take 75 mg by mouth daily.   Yes [provider]  metoprolol succinate (TOPROL-XL) 25 MG 24 hr tablet Take 1 tablet (25 mg total) by mouth daily. 10/15/16  Yes Jake Bathe, MD  vitamin B-12 (CYANOCOBALAMIN) 100 MCG tablet Take 100 mcg by mouth daily. Gummie   Yes [provider]   Allergies  Allergen Reactions  . Penicillins Rash    Has patient had a PCN reaction causing immediate rash, facial/tongue/throat swelling, SOB or lightheadedness with hypotension: No Has patient had a PCN reaction causing severe rash involving mucus membranes or skin necrosis: No Has patient had a PCN reaction that required hospitalization: No Has patient had a PCN reaction occurring within the last 10 years: No If all of the above answers are "NO", then may proceed with Cephalosporin use.    FAMILY HISTORY:  family history includes Other in his father. SOCIAL HISTORY:  reports that he quit smoking about 44 years ago. His smoking use included Cigarettes. He has never used smokeless tobacco. He reports that he drinks alcohol. He reports that he does not use drugs.  REVIEW OF SYSTEMS:   Bolds are positive  Constitutional: weight loss, gain, night sweats, Fevers, chills, fatigue .  HEENT: headaches, Sore throat, sneezing, nasal congestion, post nasal drip, Difficulty swallowing, Tooth/dental problems, visual complaints visual changes, ear ache CV:  chest pain, radiates,Orthopnea, PND, swelling in lower extremities, dizziness, palpitations, syncope.  GI  heartburn, indigestion, abdominal pain, nausea, vomiting, diarrhea, change in bowel habits, loss of appetite, bloody stools.  Resp: cough, productive:, hemoptysis, dyspnea, chest pain,  pleuritic.  Skin: rash or itching or icterus GU: dysuria, change in color of urine, urgency or frequency. flank pain, hematuria  MS: joint pain or swelling. decreased range of motion R arm heaviness,  tingling Psych: change in mood or affect. depression or anxiety.  Neuro: difficulty with speech, weakness - R arm , numbness, ataxia   SUBJECTIVE:  Off cardene drip  VITAL SIGNS: Temp:  [97.7 F (36.5 C)-98 F (36.7 C)] 97.9 F (36.6 C) (09/14 0731) Pulse Rate:  [47-74] 61 (09/14 0700) Resp:  [8-20] 15 (09/14 0700) BP: (102-248)/(44-86) 166/72 (09/14 0700) SpO2:  [90 %-98 %] 96 % (09/14 0700) Weight:  [185 lb (83.9 kg)-189 lb 6 oz (85.9 kg)] 189 lb 6 oz (85.9 kg) (09/14 0448)  PHYSICAL EXAMINATION: Gen:      No acute distress HEENT:  EOMI, sclera anicteric Neck:     No masses; no thyromegaly Lungs:    Clear to auscultation bilaterally; normal respiratory effort CV:         Regular rate and rhythm; no murmurs Abd:      + bowel sounds; soft, non-tender; no palpable masses, no distension Ext:    No edema; adequate peripheral perfusion Skin:      Warm and dry; no rash Neuro: alert and oriented x 3, no focal deficits Psych: normal mood and affect     Recent Labs Lab 02/25/17 1647 02/26/17 0519  NA 137 137  K 4.0 3.4*  CL 104 101  CO2 21* 25  BUN 13 10  CREATININE 0.85 0.81  GLUCOSE 157* 165*    Recent Labs Lab 02/25/17 1647 02/26/17 0519  HGB 14.8 14.7  HCT 44.0 44.2  WBC 9.3 13.3*  PLT 219 252   Dg Chest 2 View  Result Date: 02/25/2017 CLINICAL DATA:  Patient with weakness.  Arm tingling. EXAM: CHEST  2 VIEW COMPARISON:  None. FINDINGS: Normal cardiac and mediastinal contours. No consolidative pulmonary opacities. No pleural effusion or pneumothorax. Displaced likely old left mid clavicle fracture. Thoracic spine degenerative changes. IMPRESSION: Displace likely old left clavicle fracture. No acute cardiopulmonary process. Electronically Signed   By: Annia Belt M.D.   On: 02/25/2017 17:59   Ct Head Wo Contrast  Result Date: 02/25/2017 CLINICAL DATA:  Dizziness and lightheadedness beginning this morning. EXAM: CT HEAD WITHOUT CONTRAST TECHNIQUE: Contiguous  axial images were obtained from the base of the skull through the vertex without intravenous contrast. COMPARISON:  None. FINDINGS: BRAIN: No intraparenchymal hemorrhage, mass effect nor midline shift. The ventricles and sulci are normal for age. Patchy supratentorial white matter hypodensities less than expected for patient's age, though non-specific are most compatible with chronic small vessel ischemic disease. No acute large vascular territory infarcts. No abnormal extra-axial fluid collections. Basal cisterns are patent. VASCULAR: Trace calcific atherosclerosis of the carotid siphons. SKULL: No skull fracture. Old bilateral nasal bone fractures. No significant scalp soft tissue swelling. SINUSES/ORBITS: Chronic LEFT maxillary sinusitis. Old LEFT orbital floor fracture and anterior maxillary wall fracture with cerclage wires. Trace paranasal sinus mucosal thickening. The included ocular globes and orbital contents are non-suspicious. OTHER: None. IMPRESSION: 1. No acute intracranial process. Negative noncontrast CT HEAD for age. 2. Old facial/orbital fractures with chronic LEFT maxillary sinusitis. Electronically Signed   By: Awilda Metro M.D.   On: 02/25/2017 18:18   Mr Brain Wo Contrast  Result Date: 02/26/2017 CLINICAL DATA:  71 y/o M; dizziness and slight nausea. History of TIA. EXAM: MRI HEAD WITHOUT CONTRAST TECHNIQUE: Multiplanar, multiecho pulse sequences of  the brain and surrounding structures were obtained without intravenous contrast. COMPARISON:  02/25/2017 CT head. FINDINGS: Brain: No acute infarction, hemorrhage, hydrocephalus, extra-axial collection or mass lesion. Few nonspecific foci of T2 FLAIR hyperintense signal abnormality in subcortical and periventricular white matter are compatible with mild chronic microvascular ischemic changes for age. Mild brain parenchymal volume loss. Punctate focus of susceptibility hypo intensities within right posterior parietal lobe likely represents  hemosiderin deposition of chronic microhemorrhage. Vascular: Normal flow voids. Skull and upper cervical spine: Normal marrow signal. Sinuses/Orbits: Chronic left orbital and zygomatic complex fracture the surgical hardware creating susceptibility artifact partially obscuring the region on several sequences. Left maxillary sinus mucosal thickening. Otherwise negative. Other: None. IMPRESSION: 1. No acute intracranial abnormality. 2. Mild chronic microvascular ischemic changes and mild parenchymal volume loss of the brain. 3. Chronic left orbital and zygomatic complex fracture as well as left maxillary sinus mucosal thickening as seen on prior CT. Electronically Signed   By: Mitzi Hansen M.D.   On: 02/26/2017 00:51    ASSESSMENT / PLAN: Hypertensive emergency - with associated neurologic deficits Hemodynamic monitoring in the ICU setting Off cardene drip Start home chlorthalidone Add lisinopril 10 mg qd Holding beta blockers due to bardydardia Follow echocardiogram MRI and CT head results noted. Pending CTA per neurology  Nausea/vomiting: Likely in setting of hypertensive crisis Keep NPO Swallow eval Zofran PRN  Coronary artery disease Bradycardia Restart home Plavix, atorvastatin when able to take PO  Hyperglycemia with no history of DM.  Monitor  Diet: NPO SUP: No need. Not intubated VTE: SCDs, SQ heparin Dispo: ICU Code: Full  The patient is critically ill with multiple organ system failure and requires high complexity decision making for assessment and support, frequent evaluation and titration of therapies, advanced monitoring, review of radiographic studies and interpretation of complex data.   Critical Care Time devoted to patient care services, exclusive of separately billable procedures, described in this note is 35 minutes.   Chilton Greathouse MD [facility] Pager [phone] If no answer or after 3pm call: [phone] 02/26/2017, 9:07  AM

## 2017-02-27 ENCOUNTER — Inpatient Hospital Stay (HOSPITAL_COMMUNITY): Payer: Medicare Other

## 2017-02-27 LAB — PHOSPHORUS: PHOSPHORUS: 3.1 mg/dL (ref 2.5–4.6)

## 2017-02-27 LAB — BASIC METABOLIC PANEL
Anion gap: 10 (ref 5–15)
BUN: 17 mg/dL (ref 6–20)
CHLORIDE: 96 mmol/L — AB (ref 101–111)
CO2: 25 mmol/L (ref 22–32)
Calcium: 8.8 mg/dL — ABNORMAL LOW (ref 8.9–10.3)
Creatinine, Ser: 1.01 mg/dL (ref 0.61–1.24)
GFR calc non Af Amer: >60 mL/min (ref >60)
Glucose, Bld: 184 mg/dL — ABNORMAL HIGH (ref 65–99)
POTASSIUM: 3 mmol/L — AB (ref 3.5–5.1)
SODIUM: 131 mmol/L — AB (ref 135–145)

## 2017-02-27 LAB — TROPONIN I
TROPONIN I: 0.05 ng/mL — AB (ref <0.03)
Troponin I: 0.03 ng/mL (ref <0.03)

## 2017-02-27 LAB — CBC
HCT: 42.5 % (ref 39.0–52.0)
HEMOGLOBIN: 14.5 g/dL (ref 13.0–17.0)
MCH: 29.8 pg (ref 26.0–34.0)
MCHC: 34.1 g/dL (ref 30.0–36.0)
MCV: 87.4 fL (ref 78.0–100.0)
Platelets: 265 10*3/uL (ref 150–400)
RBC: 4.86 MIL/uL (ref 4.22–5.81)
RDW: 13.8 % (ref 11.5–15.5)
WBC: 17.2 10*3/uL — ABNORMAL HIGH (ref 4.0–10.5)

## 2017-02-27 LAB — MAGNESIUM: MAGNESIUM: 1.9 mg/dL (ref 1.7–2.4)

## 2017-02-27 LAB — LIPASE, BLOOD: Lipase: 33 U/L (ref 11–51)

## 2017-02-27 MED ORDER — SODIUM CHLORIDE 0.9 % IV SOLN
25.0000 mg | Freq: Four times a day (QID) | INTRAVENOUS | Status: DC | PRN
  Administered 2017-02-27: 25 mg via INTRAVENOUS
  Filled 2017-02-27 (×2): qty 1

## 2017-02-27 MED ORDER — POTASSIUM CHLORIDE 10 MEQ/100ML IV SOLN
10.0000 meq | INTRAVENOUS | Status: AC
  Administered 2017-02-27 (×5): 10 meq via INTRAVENOUS
  Filled 2017-02-27 (×5): qty 100

## 2017-02-27 MED ORDER — IOPAMIDOL (ISOVUE-300) INJECTION 61%
INTRAVENOUS | Status: AC
  Administered 2017-02-27: 100 mL
  Filled 2017-02-27: qty 100

## 2017-02-27 MED ORDER — POTASSIUM CHLORIDE 10 MEQ/100ML IV SOLN
INTRAVENOUS | Status: AC
  Administered 2017-02-27: 10 meq
  Filled 2017-02-27: qty 200

## 2017-02-27 MED ORDER — CALCIUM CARBONATE ANTACID 500 MG PO CHEW
1.0000 | CHEWABLE_TABLET | Freq: Three times a day (TID) | ORAL | Status: DC | PRN
  Administered 2017-02-27 (×2): 200 mg via ORAL
  Filled 2017-02-27 (×3): qty 1

## 2017-02-27 MED ORDER — METOCLOPRAMIDE HCL 5 MG/ML IJ SOLN
10.0000 mg | Freq: Once | INTRAMUSCULAR | Status: AC
  Administered 2017-02-27: 10 mg via INTRAVENOUS
  Filled 2017-02-27: qty 2

## 2017-02-27 MED ORDER — GI COCKTAIL ~~LOC~~
30.0000 mL | Freq: Three times a day (TID) | ORAL | Status: DC | PRN
  Filled 2017-02-27: qty 30

## 2017-02-27 MED ORDER — METOCLOPRAMIDE HCL 5 MG/ML IJ SOLN
5.0000 mg | Freq: Four times a day (QID) | INTRAMUSCULAR | Status: DC | PRN
  Filled 2017-02-27: qty 1

## 2017-02-27 MED ORDER — PANTOPRAZOLE SODIUM 40 MG PO TBEC
40.0000 mg | DELAYED_RELEASE_TABLET | Freq: Every day | ORAL | Status: DC
  Administered 2017-02-27 – 2017-03-01 (×3): 40 mg via ORAL
  Filled 2017-02-27 (×5): qty 1

## 2017-02-27 MED ORDER — METOPROLOL SUCCINATE ER 25 MG PO TB24
25.0000 mg | ORAL_TABLET | Freq: Every day | ORAL | Status: DC
  Administered 2017-02-27 – 2017-02-28 (×2): 25 mg via ORAL
  Filled 2017-02-27 (×2): qty 1

## 2017-02-27 NOTE — Progress Notes (Signed)
eLink Physician-Brief Progress Note Patient Name: James Hall DOB: February 09, 1946 MRN: [id number]   Date of Service  02/27/2017  HPI/Events of Note  hypokalemia  eICU Interventions  Potassium replaced     Intervention Category Intermediate Interventions: Electrolyte abnormality - evaluation and management  Madicyn Mesina 02/27/2017, 4:44 AM

## 2017-02-27 NOTE — Progress Notes (Signed)
STROKE TEAM PROGRESS NOTE   HISTORY OF PRESENT ILLNESS (per record) James Hall is an 71 y.o. male Caucasian right-handed with PMH of hypertension, CAD, hyperlipidemia, past smoker who presents with headache, nausea and right-sided weakness.  Patient states that around 10:30 a.m. noticed develops fairly sudden onset headache characterizes dull, aching about 7 x 10 in intensity. He also felt that his right arm was weak. He checked his blood pressure and was 185 systolic. He thought he would go to sleep and see first if his symptoms improved however after 2-3 hours his symptoms were somewhat better and the patient came to the emergency room. His blood pressure was 220 systolic. Weakness had resolved by the time he was in the hospital and he was not stroke alerted. He was admitted to pulmonary ICU team for hypertensive emergency and started on a nicardipine drip. Neurology was consulted for TIA/hypertensive emergency.  Date last known well: 9.13.2018 Time last known well: 10:30 tPA Given: No outside window, improved symptoms NIHSS 0 Modified Rankin: 0  SUBJECTIVE (INTERVAL HISTORY) His RN is at the bedside.  Pt still on cardene for BP management. Pt still complains of  Nausea, hiccups slight HA which has subsided. Compazine has not helped  OBJECTIVE Temp:  [98.4 F (36.9 C)-98.7 F (37.1 C)] 98.5 F (36.9 C) (09/15 0834) Pulse Rate:  [48-79] 76 (09/15 0400) Cardiac Rhythm: Sinus bradycardia (09/15 0400) Resp:  [8-18] 16 (09/15 0400) BP: (120-197)/(47-110) 153/54 (09/15 0400) SpO2:  [92 %-98 %] 96 % (09/15 0400) Weight:  [189 lb 6 oz (85.9 kg)] 189 lb 6 oz (85.9 kg) (09/15 0425)  CBC:   Recent Labs Lab 02/26/17 0519 02/27/17 0331  WBC 13.3* 17.2*  HGB 14.7 14.5  HCT 44.2 42.5  MCV 86.8 87.4  PLT 252 265    Basic Metabolic Panel:   Recent Labs Lab 02/26/17 0519 02/27/17 0331  NA 137 131*  K 3.4* 3.0*  CL 101 96*  CO2 25 25  GLUCOSE 165* 184*  BUN 10 17   CREATININE 0.81 1.01  CALCIUM 9.0 8.8*  MG 1.5* 1.9  PHOS 3.3 3.1    Lipid Panel:     Component Value Date/Time   CHOL 235 (H) 02/26/2017 1156   TRIG 218 (H) 02/26/2017 1156   HDL 42 02/26/2017 1156   CHOLHDL 5.6 02/26/2017 1156   VLDL 44 (H) 02/26/2017 1156   LDLCALC 149 (H) 02/26/2017 1156   HgbA1c:  Lab Results  Component Value Date   HGBA1C 6.5 (H) 02/26/2017   Urine Drug Screen: No results found for: LABOPIA, COCAINSCRNUR, LABBENZ, AMPHETMU, THCU, LABBARB  Alcohol Level     Component Value Date/Time   ETH <5 02/25/2017 1647    IMAGING I have personally reviewed the radiological images below and agree with the radiology interpretations.  Ct Head Wo Contrast 02/25/2017 IMPRESSION: 1. No acute intracranial process. Negative noncontrast CT HEAD for age. 2. Old facial/orbital fractures with chronic LEFT maxillary sinusitis.    Mr Brain Wo Contrast 02/26/2017 IMPRESSION: 1. No acute intracranial abnormality. 2. Mild chronic microvascular ischemic changes and mild parenchymal volume loss of the brain. 3. Chronic left orbital and zygomatic complex fracture as well as left maxillary sinus mucosal thickening as seen on prior CT.   TTE   - Left ventricle: The cavity size was normal. There was mild   concentric hypertrophy. Systolic function was normal. The   estimated ejection fraction was in the range of 60% to 65%. Wall   motion was normal;  there were no regional wall motion   abnormalities. Doppler parameters are consistent with abnormal   left ventricular relaxation (grade 1 diastolic dysfunction).   Doppler parameters are consistent with indeterminate ventricular   filling pressure. - Aortic valve: Transvalvular velocity was within the normal range.   There was no stenosis. There was no regurgitation. - Mitral valve: Transvalvular velocity was within the normal range.   There was no evidence for stenosis. There was no regurgitation. - Right ventricle: The cavity  size was normal. Wall thickness was   normal. Systolic function was normal. - Atrial septum: No defect or patent foramen ovale was identified. - Tricuspid valve: There was no regurgitation.  CTA head and neck   1. Negative CTA for large vessel occlusion. No high-grade or correctable stenosis. 2. Moderate intracranial atherosclerotic changes, most notable within the MCAs and PCAs bilaterally, greatest within the left MCA distribution. 3. Short-segment moderate approximate 50% stenosis at the proximal cavernous right ICA. 4. Atheromatous plaque at the origin of the dominant right vertebral artery with approximately 65% short-segment stenosis. Vertebral arteries otherwise widely patent within the neck. 5. Atheromatous plaque about the carotid bifurcations bilaterally without hemodynamically significant stenosis, left slightly worse than right.    PHYSICAL EXAM  Temp:  [98.4 F (36.9 C)-98.7 F (37.1 C)] 98.5 F (36.9 C) (09/15 0834) Pulse Rate:  [48-79] 76 (09/15 0400) Resp:  [8-18] 16 (09/15 0400) BP: (120-197)/(47-110) 153/54 (09/15 0400) SpO2:  [92 %-98 %] 96 % (09/15 0400) Weight:  [189 lb 6 oz (85.9 kg)] 189 lb 6 oz (85.9 kg) (09/15 0425)  General - Well nourished, well developed, in no apparent distress.  Ophthalmologic - Fundi not visualized due to small pupils.  Cardiovascular - Regular rate and rhythm.  Mental Status -  Level of arousal and orientation to time, place, and person were intact. Language including expression, naming, repetition, comprehension was assessed and found intact. Attention span and concentration were normal. Fund of Knowledge was assessed and was intact.  Cranial Nerves II - XII - II - Visual field intact OU. III, IV, VI - Extraocular movements intact.  V - Facial sensation intact bilaterally.  VII - Facial movement intact bilaterally. VIII - Hearing & vestibular intact bilaterally. X - Palate elevates symmetrically. XI - Chin turning  & shoulder shrug intact bilaterally. XII - Tongue protrusion intact.  Motor Strength - The patient's strength was normal in all extremities and pronator drift was absent.  Bulk was normal and fasciculations were absent.   Motor Tone - Muscle tone was assessed at the neck and appendages and was normal.  Reflexes - The patient's reflexes were 1+ in all extremities and he had no pathological reflexes.  Sensory - Light touch, temperature/pinprick were assessed and were symmetrical.    Coordination - The patient had normal movements in the hands and feet with no ataxia or dysmetria.  Tremor was absent.  Gait and Station - deferred.   ASSESSMENT/PLAN Mr. Jayshawn Colston is a 71 y.o. male with history of CAD, HTN, HLD presenting with headache, nausea vomiting, transient generalized weakness, right seems weaker than the left. He did not receive IV t-PA due to out of window, improved symptoms.   Hypertensive emergency / encephalopathy  Resultant  lightheadedness, nauseous, hiccups  CT head no acute abnormality  MRI head no acute stroke  CTA head and neck no LVO  2D Echo  EF 60-65%  LDL 149  HgbA1c 6.5  SCDs for VTE prophylaxis Diet Heart Room service appropriate?  Yes; Fluid consistency: Thin  clopidogrel 75 mg daily prior to admission, now on clopidogrel 75 mg daily   Meclizine when necessary  Patient counseled to be compliant with his antithrombotic medications  Ongoing aggressive stroke risk factor management  Therapy recommendations:  Pending  Disposition:  Pending  Hypertension  Stable  On Cardene  BP management as per primary team  Long-term BP goal normotensive  Hyperlipidemia  Home meds:  Lipitor 40, resumed in hospital  LDL 149, goal < 70  Increase Lipitor to 80  Continue statin at discharge  Diabetes  HgbA1c 6.5, goal < 7.0  Hyperglycemia  Close follow-up with PCP  Other Stroke Risk Factors  Advanced age  Former smoker, quit 44 years  ago  ETOH use, advised to drink no more than 2 drink(s) a day  Other Active Problems  Elevated triglyceride  Leukocytosis  Hospital day # 2  This patient is critically ill due to hypertensive emergency, hypertensive encephalopathy and at significant risk of neurological worsening, death form stroke, ICH, seizure, PRES, heart failure. This patient's care requires constant monitoring of vital signs, hemodynamics, respiratory and cardiac monitoring, review of multiple databases, neurological assessment, discussion with family, other specialists and medical decision making of high complexity. I spent 30 minutes of neurocritical care time in the care of this patient.  Recommend IV Thorazine for hiccups and Reglan for nausea.Wean off Cardene drip and resume home blood pressure medications and use IV labetalol and hydralazine when necessary Delia Heady, MD Stroke Neurology 02/27/2017 12:35 PM   To contact Stroke Continuity provider, please refer to [email or web address]. After hours, contact General Neurology

## 2017-02-27 NOTE — Progress Notes (Signed)
SLP Cancellation Note  Patient Details Name: James Hall MRN: [id number] DOB: Aug 14, 1945   Cancelled treatment:       Reason Eval/Treat Not Completed: SLP screened, no needs identified, will sign off. Swallow evaluation ordered for "stroke workup." Spoke with RN, pt passed RN swallow screen and has been tolerating a regular diet. Imaging negative for acute findings. SLP will s/o.  Rondel Baton, [place], CCC-SLP Speech-Language Pathologist [phone]   Arlana Lindau 02/27/2017, 8:13 AM

## 2017-02-27 NOTE — Progress Notes (Addendum)
Name: James Hall MRN: [id number] DOB: [birth year]    ADMISSION DATE:  02/25/2017 CONSULTATION DATE:  02/25/2017  REFERRING MD :  Dr. Rubin Payor  CHIEF COMPLAINT:  Hypertension  HISTORY OF PRESENT ILLNESS:  71 year old male with past medical history as below, which is significant for hypertension, coronary artery disease status post PCI, and hyperlipidemia. Family reports he is typically very healthy. He was in his usual state of health until the morning hours of 9/13 when he initially complains of "feeling weird" in his head, and eventually developed a heaviness and tingling of his right arm compared with a facial droop. He presented to [facility] emergency department with these complaints and was subsequent found to be profound hypertensive. His highest blood pressure measurement was 248/79. He was given a one-time dose of IV hydralazine and attempts to lower his blood pressure. He underwent CT scan, which was negative. His hypotension was refractory to IV hydralazine and PCCM was asked to see for further evaluation.  SIGNIFICANT EVENTS  9/13 admit to ICU on a Cardizem infusion 9/14 >> off cardizem drip  STUDIES:  CT head 9/13> no acute endocrine or process. Negative CT head for age. Old facial/orbital fractures with chronic left maxillary sinusitis. MRI brain 9/14>>> No acute process CTA head 9/14> negative for large vessel occlusion, 50% stenosis of ICA, 65% vetebral A stenosis Echo 9/14>> LVEF 60-65%, grade 1 diastolic dysfunction  PAST MEDICAL HISTORY :   has a past medical history of Atherosclerotic heart disease of native coronary artery without angina pectoris; Coronary arteriosclerosis due to lipid rich plaque; Hypertension; and Mixed hyperlipidemia.  has a past surgical history that includes Carotid stent (2007). Prior to Admission medications   Medication Sig Start Date End Date Taking? Authorizing Provider  atorvastatin (LIPITOR) 40 MG tablet Take 1 tablet (40 mg total)  by mouth daily. 10/15/16  Yes Jake Bathe, MD  chlorthalidone (HYGROTON) 25 MG tablet Take 1 tablet (25 mg total) by mouth daily. 10/15/16  Yes Jake Bathe, MD  clopidogrel (PLAVIX) 75 MG tablet Take 75 mg by mouth daily.   Yes [provider]  metoprolol succinate (TOPROL-XL) 25 MG 24 hr tablet Take 1 tablet (25 mg total) by mouth daily. 10/15/16  Yes Jake Bathe, MD  vitamin B-12 (CYANOCOBALAMIN) 100 MCG tablet Take 100 mcg by mouth daily. Gummie   Yes [provider]   Allergies  Allergen Reactions  . Penicillins Rash    Has patient had a PCN reaction causing immediate rash, facial/tongue/throat swelling, SOB or lightheadedness with hypotension: No Has patient had a PCN reaction causing severe rash involving mucus membranes or skin necrosis: No Has patient had a PCN reaction that required hospitalization: No Has patient had a PCN reaction occurring within the last 10 years: No If all of the above answers are "NO", then may proceed with Cephalosporin use.    FAMILY HISTORY:  family history includes Other in his father. SOCIAL HISTORY:  reports that he quit smoking about 44 years ago. His smoking use included Cigarettes. He has never used smokeless tobacco. He reports that he drinks alcohol. He reports that he does not use drugs.  REVIEW OF SYSTEMS:   Bolds are positive  Constitutional: weight loss, gain, night sweats, Fevers, chills, fatigue .  HEENT: headaches, Sore throat, sneezing, nasal congestion, post nasal drip, Difficulty swallowing, Tooth/dental problems, visual complaints visual changes, ear ache CV:  chest pain, radiates,Orthopnea, PND, swelling in lower extremities, dizziness, palpitations, syncope.  GI  heartburn, indigestion,  abdominal pain, nausea, vomiting, diarrhea, change in bowel habits, loss of appetite, bloody stools.  Resp: cough, productive:, hemoptysis, dyspnea, chest pain, pleuritic.  Skin: rash or itching or icterus GU: dysuria, change in  color of urine, urgency or frequency. flank pain, hematuria  MS: joint pain or swelling. decreased range of motion R arm heaviness, tingling Psych: change in mood or affect. depression or anxiety.  Neuro: difficulty with speech, weakness - R arm , numbness, ataxia   SUBJECTIVE:  Off cardene drip  VITAL SIGNS: Temp:  [98.4 F (36.9 C)-98.7 F (37.1 C)] 98.5 F (36.9 C) (09/15 0834) Pulse Rate:  [48-79] 76 (09/15 0400) Resp:  [8-18] 16 (09/15 0400) BP: (120-197)/(47-110) 153/54 (09/15 0400) SpO2:  [92 %-98 %] 96 % (09/15 0400) Weight:  [189 lb 6 oz (85.9 kg)] 189 lb 6 oz (85.9 kg) (09/15 0425)  PHYSICAL EXAMINATION: Gen:      No acute distress HEENT:  EOMI, sclera anicteric Neck:     No masses; no thyromegaly Lungs:    Clear to auscultation bilaterally; normal respiratory effort CV:         Regular rate and rhythm; no murmurs Abd:      + bowel sounds; soft, non-tender; no palpable masses, no distension Ext:    No edema; adequate peripheral perfusion Skin:      Warm and dry; no rash Neuro: alert and oriented x 3, no focal deficits Psych: normal mood and affect   Recent Labs Lab 02/25/17 1647 02/26/17 0519 02/27/17 0331  NA 137 137 131*  K 4.0 3.4* 3.0*  CL 104 101 96*  CO2 21* 25 25  BUN CREATININE 0.85 0.81 1.01  GLUCOSE 157* 165* 184*    Recent Labs Lab 02/25/17 1647 02/26/17 0519 02/27/17 0331  HGB 14.8 14.7 14.5  HCT 44.0 44.2 42.5  WBC 9.3 13.3* 17.2*  PLT 219 252 265     ASSESSMENT / PLAN: Hypertensive emergency - with associated neurologic deficits Hemodynamic monitoring in the ICU setting Off cardene drip Continue chlorthalidone, lisinopril 10 mg qd Restart metoprolol Nerology is on board  Intractable Nausea/vomiting: ? From hypertensive crisis PO diet Zofran PRN Protonix, GI cocktail CT abd/pelvis Check lipase  Coronary artery disease Bradycardia- resolved Mild troponin elevation- Echo and EKG without sign of ischemic  changes Restart home Plavix, atorvastatin Follow troponin.  Hyperglycemia with no history of DM.  Monitor  Diet: Diet SUP: protonix VTE: SCDs, SQ heparin Dispo:Transfer out of ICU Code: Full  Chilton Greathouse MD [facility] Pager [phone] If no answer or after 3pm call: [phone] 02/27/2017, 12:17 PM

## 2017-02-28 ENCOUNTER — Inpatient Hospital Stay (HOSPITAL_COMMUNITY): Payer: Medicare Other

## 2017-02-28 DIAGNOSIS — I674 Hypertensive encephalopathy: Secondary | ICD-10-CM

## 2017-02-28 DIAGNOSIS — R7303 Prediabetes: Secondary | ICD-10-CM

## 2017-02-28 DIAGNOSIS — E782 Mixed hyperlipidemia: Secondary | ICD-10-CM

## 2017-02-28 LAB — BASIC METABOLIC PANEL
ANION GAP: 10 (ref 5–15)
BUN: 17 mg/dL (ref 6–20)
CALCIUM: 9.5 mg/dL (ref 8.9–10.3)
CO2: 27 mmol/L (ref 22–32)
Chloride: 100 mmol/L — ABNORMAL LOW (ref 101–111)
Creatinine, Ser: 1.07 mg/dL (ref 0.61–1.24)
GFR calc Af Amer: >60 mL/min (ref >60)
GLUCOSE: 149 mg/dL — AB (ref 65–99)
Potassium: 3.3 mmol/L — ABNORMAL LOW (ref 3.5–5.1)
Sodium: 137 mmol/L (ref 135–145)

## 2017-02-28 LAB — CBC
HEMATOCRIT: 44.1 % (ref 39.0–52.0)
Hemoglobin: 14.2 g/dL (ref 13.0–17.0)
MCH: 28.5 pg (ref 26.0–34.0)
MCHC: 32.2 g/dL (ref 30.0–36.0)
MCV: 88.6 fL (ref 78.0–100.0)
PLATELETS: 286 10*3/uL (ref 150–400)
RBC: 4.98 MIL/uL (ref 4.22–5.81)
RDW: 14.5 % (ref 11.5–15.5)
WBC: 10.5 10*3/uL (ref 4.0–10.5)

## 2017-02-28 LAB — TROPONIN I: TROPONIN I: 0.03 ng/mL — AB (ref <0.03)

## 2017-02-28 LAB — MAGNESIUM: Magnesium: 1.8 mg/dL (ref 1.7–2.4)

## 2017-02-28 LAB — PHOSPHORUS: Phosphorus: 2.6 mg/dL (ref 2.5–4.6)

## 2017-02-28 MED ORDER — POTASSIUM CHLORIDE CRYS ER 20 MEQ PO TBCR
40.0000 meq | EXTENDED_RELEASE_TABLET | Freq: Once | ORAL | Status: AC
  Administered 2017-02-28: 40 meq via ORAL
  Filled 2017-02-28: qty 2

## 2017-02-28 MED ORDER — AMLODIPINE BESYLATE 10 MG PO TABS
10.0000 mg | ORAL_TABLET | Freq: Every day | ORAL | Status: DC
  Administered 2017-02-28 – 2017-03-02 (×3): 10 mg via ORAL
  Filled 2017-02-28 (×3): qty 1

## 2017-02-28 NOTE — Progress Notes (Signed)
PROGRESS NOTE    James Hall  ZOX:[id number] DOB: [birth year] DOA: 02/25/2017 PCP: Lenell Antu, DO    Brief Narrative:  71 year old male who presented with elevated blood pressure. Patient has significant history of hypertension, coronary artery disease and dyslipidemia. On the day of admission he suddenly developed RSD she is in his right arm, associated with a facial droop. On initial physical examination his blood pressure was 248/79, temperature 97.7, heart rate 51, respiratory rate 14, oxygen saturation 97%, patient was alert, positive facial droop, right arm weakness, lungs clear to auscultation bilaterally, heart S1-S2 present bradycardic, no gallops, rubs or murmurs, the abdomen was soft nontender, no extremity edema. Sodium 137, potassium 4.0, chloride 104, bicarbonate 21, glucose 157, BUN 13, creatinine 0.85, white cell count 9.3, hemoglobin 14.8, hematocrit 44.0, platelets 219, INR 0.98. Head CT no acute changes. Chest x-ray, well inspissated, well penetrated, no significant rotation, increase interstitial markings bilaterally, cephalization of the vasculature, no effusions. No infiltrates. EKG was sinus rhythm, normal axis, normal intervals.  Patient was admitted to the hospital with the working diagnosis of hypertensive emergency.  Assessment & Plan:   Active Problems:   Hypertensive emergency   Mixed hyperlipidemia   Hypertensive encephalopathy   Prediabetes   Leukocytosis   1. Hypertensive emergency, with hypertensive encephalopathy. Patient is off nicardipine drip, blood pressure systolic peak at 811, will continue chlorthalidone and lisinopril, will change metoprolol to amlodipine for better, blood pressure control, noted patient with bradycardia. Echocardiogram with normal LV systolic function with ejection fraction 60 to 65%. No neurological focal deficits. MRI and CT negative. Will continue antiplatelet therapy with clopidogrel.  2. Dyslipidemia. Continue statin  therapy, with good toleration.  3. Pre diabetes mellitus. Capillary glucose 159 and 174. Patient tolerating po well. Fasting glucose 149, will check hb a1c.     DVT prophylaxis: enoxaparin  Code Status: Full Family Communication:  Disposition Plan: Home   Consultants:   Neurology  Procedures:     Antimicrobials:       Subjective: Patient with no chest pain or dyspnea. Positive dizziness, moderate in intensity, worse with ambulation, improving with current medical treatment, no associated symptoms. No nausea or vomiting.   Objective: Vitals:   02/27/17 2110 02/27/17 2356 02/28/17 0309 02/28/17 0320  BP: (!) 128/58 121/79  (!) 186/73  Pulse: 68 88 80 75  Resp: Temp:  98.2 F (36.8 C) 98 F (36.7 C)   TempSrc:  Oral Oral   SpO2: 96%  91% 95%  Weight:   86.2 kg (190 lb 1.6 oz)   Height:        Intake/Output Summary (Last 24 hours) at 02/28/17 0739 Last data filed at 02/28/17 0319  Gross per 24 hour  Intake              715 ml  Output             3350 ml  Net            -2635 ml   Filed Weights   02/27/17 0425 02/27/17 2052 02/28/17 0309  Weight: 85.9 kg (189 lb 6 oz) 87.3 kg (192 lb 8 oz) 86.2 kg (190 lb 1.6 oz)    Examination:  General: Not in pain or dyspnea. Mild deconditioning.  Neurology: Awake and alert, non focal  E ENT: no pallor, no icterus, oral mucosa moist Cardiovascular: No JVD. S1-S2 present, rhythmic, no gallops, rubs, or murmurs. No lower extremity edema. Pulmonary: vesicular breath sounds bilaterally,  adequate air movement, no wheezing, rhonchi or rales. Gastrointestinal. Abdomen flat, no organomegaly, non tender, no rebound or guarding Skin. No rashes Musculoskeletal: no joint deformities     Data Reviewed: I have personally reviewed following labs and imaging studies  CBC:  Recent Labs Lab 02/25/17 1647 02/26/17 0519 02/27/17 0331 02/28/17 0349  WBC 9.3 13.3* 17.2* 10.5  HGB 14.8 14.7 14.5 14.2  HCT 44.0 44.2  42.5 44.1  MCV 88.2 86.8 87.4 88.6  PLT 219 252 265 286   Basic Metabolic Panel:  Recent Labs Lab 02/25/17 1647 02/26/17 0519 02/27/17 0331 02/28/17 0349  NA 137 137 131* 137  K 4.0 3.4* 3.0* 3.3*  CL 104 101 96* 100*  CO2 21* GLUCOSE 157* 165* 184* 149*  BUN CREATININE 0.85 0.81 1.01 1.07  CALCIUM 9.4 9.0 8.8* 9.5  MG  --  1.5* 1.9 1.8  PHOS  --  3.3 3.1 2.6   GFR: Estimated Creatinine Clearance: 64.4 mL/min (by C-G formula based on SCr of 1.07 mg/dL). Liver Function Tests:  Recent Labs Lab 02/25/17 1647  AST 37  ALT 46  ALKPHOS 61  BILITOT 0.9  PROT 7.1  ALBUMIN 4.1    Recent Labs Lab 02/27/17 1949  LIPASE 33   No results for input(s): AMMONIA in the last 168 hours. Coagulation Profile:  Recent Labs Lab 02/25/17 1647  INR 0.98   Cardiac Enzymes:  Recent Labs Lab 02/26/17 0519 02/26/17 1156 02/27/17 0331 02/27/17 1625 02/28/17 0349  TROPONINI <0.03 <0.03 0.03* 0.05* 0.03*   BNP (last 3 results) No results for input(s): PROBNP in the last 8760 hours. HbA1C:  Recent Labs  02/26/17 0519  HGBA1C 6.5*   CBG:  Recent Labs Lab 02/25/17 1646 02/25/17 2134  GLUCAP 159* 174*   Lipid Profile:  Recent Labs  02/26/17 1156  CHOL 235*  HDL 42  LDLCALC 149*  TRIG 218*  CHOLHDL 5.6   Thyroid Function Tests: No results for input(s): TSH, T4TOTAL, FREET4, T3FREE, THYROIDAB in the last 72 hours. Anemia Panel: No results for input(s): VITAMINB12, FOLATE, FERRITIN, TIBC, IRON, RETICCTPCT in the last 72 hours.    Radiology Studies: I have reviewed all of the imaging during this hospital visit personally     Scheduled Meds: . atorvastatin  80 mg Oral q1800  . chlorthalidone  25 mg Oral Daily  . clopidogrel  75 mg Oral Daily  . heparin  5,000 Units Subcutaneous Q8H  . lisinopril  10 mg Oral Daily  . metoprolol succinate  25 mg Oral Daily  . pantoprazole  40 mg Oral Q1200   Continuous Infusions: . sodium  chloride    . sodium chloride 10 mL/hr at 02/27/17 1900  . chlorproMAZINE (THORAZINE) IV Stopped (02/27/17 1216)     LOS: 3 days        Mauricio Annett Gula, MD [facility] Pager [phone]

## 2017-02-28 NOTE — Progress Notes (Addendum)
Pt ambulated with front wheel walker 360 ft down hallway and back to room. Pt was very unstable, stated he developed sob and dizziness while walking. Had to take a break in the middle of walk. VS HR 104 BP 189/74 O2 97%. Will monitor BP before giving Apresoline PRN

## 2017-02-28 NOTE — Progress Notes (Signed)
STROKE TEAM PROGRESS NOTE   HISTORY OF PRESENT ILLNESS (per record) James Hall is an 71 y.o. male Caucasian right-handed with PMH of hypertension, CAD, hyperlipidemia, past smoker who presents with headache, nausea and right-sided weakness.  Patient states that around 10:30 a.m. noticed develops fairly sudden onset headache characterizes dull, aching about 7 x 10 in intensity. He also felt that his right arm was weak. He checked his blood pressure and was 185 systolic. He thought he would go to sleep and see first if his symptoms improved however after 2-3 hours his symptoms were somewhat better and the patient came to the emergency room. His blood pressure was 220 systolic. Weakness had resolved by the time he was in the hospital and he was not stroke alerted. He was admitted to pulmonary ICU team for hypertensive emergency and started on a nicardipine drip. Neurology was consulted for TIA/hypertensive emergency.  Date last known well: 9.13.2018 Time last known well: 10:30 tPA Given: No outside window, improved symptoms NIHSS 0 Modified Rankin: 0  SUBJECTIVE (INTERVAL HISTORY)  Patient's states is doing a lot better. Hiccups are practically gone and nausea is a lot improved. Blood pressure is also moderately controlled and is off the Cardene drip.  OBJECTIVE Temp:  [97.9 F (36.6 C)-98.6 F (37 C)] 98 F (36.7 C) (09/16 0309) Pulse Rate:  [61-105] 75 (09/16 0320) Cardiac Rhythm: Normal sinus rhythm (09/16 0700) Resp:  [9-21] 14 (09/16 0320) BP: (115-186)/(50-98) 186/73 (09/16 0320) SpO2:  [91 %-97 %] 95 % (09/16 0320) Weight:  [190 lb 1.6 oz (86.2 kg)-192 lb 8 oz (87.3 kg)] 190 lb 1.6 oz (86.2 kg) (09/16 0309)  CBC:   Recent Labs Lab 02/27/17 0331 02/28/17 0349  WBC 17.2* 10.5  HGB 14.5 14.2  HCT 42.5 44.1  MCV 87.4 88.6  PLT 265 286    Basic Metabolic Panel:   Recent Labs Lab 02/27/17 0331 02/28/17 0349  NA 131* 137  K 3.0* 3.3*  CL 96* 100*  CO2 25 27   GLUCOSE 184* 149*  BUN 17 17  CREATININE 1.01 1.07  CALCIUM 8.8* 9.5  MG 1.9 1.8  PHOS 3.1 2.6    Lipid Panel:     Component Value Date/Time   CHOL 235 (H) 02/26/2017 1156   TRIG 218 (H) 02/26/2017 1156   HDL 42 02/26/2017 1156   CHOLHDL 5.6 02/26/2017 1156   VLDL 44 (H) 02/26/2017 1156   LDLCALC 149 (H) 02/26/2017 1156   HgbA1c:  Lab Results  Component Value Date   HGBA1C 6.5 (H) 02/26/2017   Urine Drug Screen: No results found for: LABOPIA, COCAINSCRNUR, LABBENZ, AMPHETMU, THCU, LABBARB  Alcohol Level     Component Value Date/Time   ETH <5 02/25/2017 1647    IMAGING I have personally reviewed the radiological images below and agree with the radiology interpretations.  Ct Head Wo Contrast 02/25/2017 IMPRESSION: 1. No acute intracranial process. Negative noncontrast CT HEAD for age. 2. Old facial/orbital fractures with chronic LEFT maxillary sinusitis.    Mr Brain Wo Contrast 02/26/2017 IMPRESSION: 1. No acute intracranial abnormality. 2. Mild chronic microvascular ischemic changes and mild parenchymal volume loss of the brain. 3. Chronic left orbital and zygomatic complex fracture as well as left maxillary sinus mucosal thickening as seen on prior CT.   TTE   - Left ventricle: The cavity size was normal. There was mild   concentric hypertrophy. Systolic function was normal. The   estimated ejection fraction was in the range of 60% to 65%.  Wall   motion was normal; there were no regional wall motion   abnormalities. Doppler parameters are consistent with abnormal   left ventricular relaxation (grade 1 diastolic dysfunction).   Doppler parameters are consistent with indeterminate ventricular   filling pressure. - Aortic valve: Transvalvular velocity was within the normal range.   There was no stenosis. There was no regurgitation. - Mitral valve: Transvalvular velocity was within the normal range.   There was no evidence for stenosis. There was no regurgitation. -  Right ventricle: The cavity size was normal. Wall thickness was   normal. Systolic function was normal. - Atrial septum: No defect or patent foramen ovale was identified. - Tricuspid valve: There was no regurgitation.  CTA head and neck   1. Negative CTA for large vessel occlusion. No high-grade or correctable stenosis. 2. Moderate intracranial atherosclerotic changes, most notable within the MCAs and PCAs bilaterally, greatest within the left MCA distribution. 3. Short-segment moderate approximate 50% stenosis at the proximal cavernous right ICA. 4. Atheromatous plaque at the origin of the dominant right vertebral artery with approximately 65% short-segment stenosis. Vertebral arteries otherwise widely patent within the neck. 5. Atheromatous plaque about the carotid bifurcations bilaterally without hemodynamically significant stenosis, left slightly worse than right.    PHYSICAL EXAM  Temp:  [97.9 F (36.6 C)-98.6 F (37 C)] 98 F (36.7 C) (09/16 0309) Pulse Rate:  [61-105] 75 (09/16 0320) Resp:  [9-21] 14 (09/16 0320) BP: (115-186)/(50-98) 186/73 (09/16 0320) SpO2:  [91 %-97 %] 95 % (09/16 0320) Weight:  [190 lb 1.6 oz (86.2 kg)-192 lb 8 oz (87.3 kg)] 190 lb 1.6 oz (86.2 kg) (09/16 0309)  General - obese middle-age Caucasian male, in no apparent distress.  Ophthalmologic - Fundi not visualized due to small pupils.  Cardiovascular - Regular rate and rhythm.  Mental Status -  Level of arousal and orientation to time, place, and person were intact. Language including expression, naming, repetition, comprehension was assessed and found intact. Attention span and concentration were normal. Fund of Knowledge was assessed and was intact.  Cranial Nerves II - XII - II - Visual field intact OU. III, IV, VI - Extraocular movements intact.  V - Facial sensation intact bilaterally.  VII - Facial movement intact bilaterally. VIII - Hearing & vestibular intact bilaterally. X  - Palate elevates symmetrically. XI - Chin turning & shoulder shrug intact bilaterally. XII - Tongue protrusion intact.  Motor Strength - The patient's strength was normal in all extremities and pronator drift was absent.  Bulk was normal and fasciculations were absent.   Motor Tone - Muscle tone was assessed at the neck and appendages and was normal.  Reflexes - The patient's reflexes were 1+ in all extremities and he had no pathological reflexes.  Sensory - Light touch, temperature/pinprick were assessed and were symmetrical.    Coordination - The patient had normal movements in the hands and feet with no ataxia or dysmetria.  Tremor was absent.  Gait and Station - deferred.   ASSESSMENT/PLAN Mr. James Hall is a 71 y.o. male with history of CAD, HTN, HLD presenting with headache, nausea vomiting, transient generalized weakness, right seems weaker than the left. He did not receive IV t-PA due to out of window, improved symptoms.   Hypertensive emergency / encephalopathy  Resultant  lightheadedness, nauseous, hiccups  CT head no acute abnormality  MRI head no acute stroke  CTA head and neck no LVO  2D Echo  EF 60-65%  LDL 149  HgbA1c  6.5  SCDs for VTE prophylaxis Diet Heart Room service appropriate? Yes; Fluid consistency: Thin  clopidogrel 75 mg daily prior to admission, now on clopidogrel 75 mg daily   Meclizine when necessary  Patient counseled to be compliant with his antithrombotic medications  Ongoing aggressive stroke risk factor management  Therapy recommendations:  Pending  Disposition:  Pending  Hypertension  Stable  On Cardene  BP management as per primary team  Long-term BP goal normotensive  Hyperlipidemia  Home meds:  Lipitor 40, resumed in hospital  LDL 149, goal < 70  Increase Lipitor to 80  Continue statin at discharge  Diabetes  HgbA1c 6.5, goal < 7.0  Hyperglycemia  Close follow-up with PCP  Other Stroke Risk  Factors  Advanced age  Former smoker, quit 44 years ago  ETOH use, advised to drink no more than 2 drink(s) a day  Other Active Problems  Elevated triglyceride  Leukocytosis  Hospital day # 3   Continue when necessary Thorazine for hiccups and Reglan for nausea.patient counseled to be compliant with his blood pressure medications and to have regular follow-up with his primary care physician. Stroke team will sign off at the present time. Kindly call for questions. Discussed with Dr. Carroll Kinds. Greater than 50% time during this 25 minute visit  was spent on counseling and coordination of care about his hypertensive urgency, hiccups and nausea and answering questions. Delia Heady, MD Medical Director [facility] Pager: [phone] 02/28/2017 11:41 AM

## 2017-03-01 LAB — BASIC METABOLIC PANEL
ANION GAP: 8 (ref 5–15)
BUN: 19 mg/dL (ref 6–20)
CHLORIDE: 99 mmol/L — AB (ref 101–111)
CO2: 29 mmol/L (ref 22–32)
Calcium: 9.3 mg/dL (ref 8.9–10.3)
Creatinine, Ser: 1.08 mg/dL (ref 0.61–1.24)
GFR calc non Af Amer: >60 mL/min (ref >60)
Glucose, Bld: 151 mg/dL — ABNORMAL HIGH (ref 65–99)
POTASSIUM: 3.6 mmol/L (ref 3.5–5.1)
Sodium: 136 mmol/L (ref 135–145)

## 2017-03-01 LAB — HEMOGLOBIN A1C
Hgb A1c MFr Bld: 6.5 % — ABNORMAL HIGH (ref 4.8–5.6)
MEAN PLASMA GLUCOSE: 139.85 mg/dL

## 2017-03-01 MED ORDER — POTASSIUM CHLORIDE CRYS ER 20 MEQ PO TBCR
40.0000 meq | EXTENDED_RELEASE_TABLET | Freq: Once | ORAL | Status: AC
  Administered 2017-03-01: 40 meq via ORAL
  Filled 2017-03-01: qty 2

## 2017-03-01 NOTE — Evaluation (Signed)
Physical Therapy Evaluation Patient Details Name: James Hall MRN: [id number] DOB: April 05, 1946 Today's Date: 03/01/2017   History of Present Illness  Pt adm with Hypertensive emergency, with hypertensive encephalopathy. Pt with c/o rt side weakness that resolved. CT and MRI negative for acute abnormalities. PMH - HTN, CAD  Clinical Impression  Pt presents to PT with unstable gait making unassisted ambulation a high fall risk activity. Pt initially described being dizzy with activity. Checked sitting and standing BP's which were both 160's/60's so pt not orhtostatic. SpO2 96%. Had pt perform repeated head turns. No nystagmus and pt reported blurred vision and not really dizziness. After mobilizing pt and questioned if he felt dizzy or off-balance, pt reported he felt off-balance.  Currently pt will need assist any time he is ambulating. Pt reports his daughter, son-in-law and three teenage grandchildren can provide this. Therefore will recommend HHPT and rolling walker for home. Emphasized to pt that he should not be amb on his own at this time.     Follow Up Recommendations Home health PT;Supervision for mobility/OOB    Equipment Recommendations  Rolling walker with 5" wheels    Recommendations for Other Services OT consult     Precautions / Restrictions Precautions Precautions: Fall Restrictions Weight Bearing Restrictions: No      Mobility  Bed Mobility               General bed mobility comments: Pt up in chair  Transfers Overall transfer level: Needs assistance Equipment used: None;Rolling walker (2 wheeled) Transfers: Sit to/from Stand Sit to Stand: Min guard         General transfer comment: Assist for safety due to poor balance  Ambulation/Gait Ambulation/Gait assistance: Min assist Ambulation Distance (Feet): 350 Feet Assistive device: Rolling walker (2 wheeled);1 person hand held assist Gait Pattern/deviations: Step-through pattern;Decreased stride  length;Wide base of support;Staggering left Gait velocity: At times too fast for safety   General Gait Details: Assist for balance. With walker pt more stable but leans more heavily on lt. Verbal cues to slow down. Without walker pt with definite lt lean and staggering  Stairs            Wheelchair Mobility    Modified Rankin (Stroke Patients Only) Modified Rankin (Stroke Patients Only) Pre-Morbid Rankin Score: No symptoms Modified Rankin: Moderately severe disability     Balance Overall balance assessment: Needs assistance Sitting-balance support: No upper extremity supported;Feet supported Sitting balance-Leahy Scale: Good     Standing balance support: No upper extremity supported Standing balance-Leahy Scale: Poor Standing balance comment: Pt requires min guard for safety with static standing                             Pertinent Vitals/Pain Pain Assessment: No/denies pain    Home Living Family/patient expects to be discharged to:: Private residence Living Arrangements: Spouse/significant other (wife is diabled) Available Help at Discharge: Family;Available 24 hours/day Type of Home: House Home Access: Ramped entrance     Home Layout: One level Home Equipment: None Additional Comments: Daughter and son-in-law  and three teenage grandchildren live on same property and are able to assist. Family assisting wife at home.    Prior Function Level of Independence: Independent               Hand Dominance        Extremity/Trunk Assessment   Upper Extremity Assessment Upper Extremity Assessment: Defer to OT evaluation  Lower Extremity Assessment Lower Extremity Assessment: Overall WFL for tasks assessed       Communication   Communication: No difficulties  Cognition Arousal/Alertness: Awake/alert Behavior During Therapy: Impulsive Overall Cognitive Status: Within Functional Limits for tasks assessed                                         General Comments      Exercises     Assessment/Plan    PT Assessment Patient needs continued PT services  PT Problem List Decreased balance;Decreased mobility;Decreased knowledge of use of DME       PT Treatment Interventions DME instruction;Gait training;Functional mobility training;Therapeutic activities;Balance training;Therapeutic exercise;Patient/family education    PT Goals (Current goals can be found in the Care Plan section)  Acute Rehab PT Goals Patient Stated Goal: return to his prior level of function PT Goal Formulation: With patient Time For Goal Achievement: 03/08/17 Potential to Achieve Goals: Good    Frequency Min 3X/week   Barriers to discharge        Co-evaluation               AM-PAC PT "6 Clicks" Daily Activity  Outcome Measure Difficulty turning over in bed (including adjusting bedclothes, sheets and blankets)?: None Difficulty moving from lying on back to sitting on the side of the bed? : None Difficulty sitting down on and standing up from a chair with arms (e.g., wheelchair, bedside commode, etc,.)?: Unable Help needed moving to and from a bed to chair (including a wheelchair)?: A Little Help needed walking in hospital room?: A Little Help needed climbing 3-5 steps with a railing? : A Little 6 Click Score: 18    End of Session Equipment Utilized During Treatment: Gait belt Activity Tolerance: Patient tolerated treatment well Patient left: in chair;with call bell/phone within reach Nurse Communication: Mobility status PT Visit Diagnosis: Unsteadiness on feet (R26.81);Ataxic gait (R26.0)    Time: 1610-9604 PT Time Calculation (min) (ACUTE ONLY): 29 min   Charges:   PT Evaluation $PT Eval Moderate Complexity: 1 Mod PT Treatments $Gait Training: 8-22 mins   PT G Codes:        [facility] PT [phone]   Angelina Ok [facility] 03/01/2017, 9:59 AM

## 2017-03-01 NOTE — Progress Notes (Signed)
PT Cancellation Note  Patient Details Name: James Hall MRN: [id number] DOB: 02-02-1946   Cancelled Treatment:    Reason Eval/Treat Not Completed: Patient at procedure or test/unavailable. Will check back later.   Angelina Ok [place] 03/01/2017, 9:05 AM Skip Mayer PT [phone]

## 2017-03-01 NOTE — Care Management Important Message (Signed)
Important Message  Patient Details  Name: James Hall MRN: [id number] Date of Birth: 26-Apr-1946   Medicare Important Message Given:  Yes    Kyla Balzarine 03/01/2017, 10:44 AM

## 2017-03-01 NOTE — Care Management Note (Signed)
Case Management Note  Patient Details  Name: James Hall MRN: [id number] Date of Birth: 1945/10/14  Subjective/Objective: Pt presented for Hypertension Urgency 248/79. Initiated on IV Hydralazine and was initiated on IV Cardizem gtt and no off. Pt was having c/o Right sided weakness- CT/ MRI negative. PTA- pt was from home with disabled wife. Pt states daughter is across the street and will be able to assist with care- she is a Charity fundraiser.                    Action/Plan: CM did offer choice for [facility] PT Services- Pt lives in [place]. Pt chose [place]- Referral made to [facility] for [place] and [facility] to begin within 24-48 hours post d/c. RW ordered and will be delivered by [facility]. Pt will need HH PT orders and F2F and DME RW orders.  Expected Discharge Date:                  Expected Discharge Plan:  Home w Home Health Services  In-House Referral:  NA  Discharge planning Services  CM Consult  Post Acute Care Choice:  Home Health Choice offered to:  NA  DME Arranged:  Dan Humphreys DME Agency:  [facility]  HH Arranged:  PT [facility] Agency:   Frances Furbish  Status of Service:  Completed, signed off  If discussed at Long Length of Stay Meetings, dates discussed:    Additional Comments:  Gala Lewandowsky, RN 03/01/2017, 1:30 PM

## 2017-03-01 NOTE — Progress Notes (Signed)
PROGRESS NOTE    James Hall  GEX:[id number] DOB: 1945/10/15 DOA: 02/25/2017 PCP: Lenell Antu, DO    Brief Narrative:  71 year old male who presented with elevated blood pressure. Patient has significant history of hypertension, coronary artery disease and dyslipidemia. On the day of admission he suddenly developed RSD she is in his right arm, associated with a facial droop. On initial physical examination his blood pressure was 248/79, temperature 97.7, heart rate 51, respiratory rate 14, oxygen saturation 97%, patient was alert, positive facial droop, right arm weakness, lungs clear to auscultation bilaterally, heart S1-S2 present bradycardic, no gallops, rubs or murmurs, the abdomen was soft nontender, no extremity edema. Sodium 137, potassium 4.0, chloride 104, bicarbonate 21, glucose 157, BUN 13, creatinine 0.85, white cell count 9.3, hemoglobin 14.8, hematocrit 44.0, platelets 219, INR 0.98. Head CT no acute changes. Chest x-ray, well inspissated, well penetrated, no significant rotation, increase interstitial markings bilaterally, cephalization of the vasculature, no effusions. No infiltrates. EKG was sinus rhythm, normal axis, normal intervals.  Patient was admitted to the hospital with the working diagnosis of hypertensive emergency.   Assessment & Plan:   Active Problems:   Hypertensive emergency   Mixed hyperlipidemia   Hypertensive encephalopathy   Prediabetes   Leukocytosis   1. Hypertensive emergency, with hypertensive encephalopathy. Blood pressure systolic in the 170's, will continue lisinopril, chlorthalidone and amlodipine. HR 60 and 100, off metroprolol, tolerating well. No signs of volume overload. Patient  continue to have significant dizziness while ambulating, will follow on physical therapy recommendations. Continue with clopidogrel   2. Dyslipidemia. Continue high potency statin with atorvastatin 80 mg, with good toleration.  3. Diabetes mellitus type 2.  Fasting glucose 151 and HbA1c 6,5 %, will start patient with metformin at discharge. Will continue glucose monitoring.   DVT prophylaxis: enoxaparin  Code Status: Full Family Communication:  Disposition Plan: Home   Consultants:   Neurology  Procedures:     Antimicrobials:      Subjective: Had significant dizziness and ambulation, that has been improving, no chest pain or dyspnea, no headache. No nausea or vomiting.   Objective: Vitals:   02/28/17 1927 02/28/17 2040 03/01/17 0328 03/01/17 0330  BP: (!) 188/73 (!) 141/45 (!) 112/58   Pulse: 78 84 66   Resp: Temp:  98.3 F (36.8 C) 98 F (36.7 C)   TempSrc:  Oral    SpO2: 96% 97% 95%   Weight:    85.6 kg (188 lb 11.4 oz)  Height:        Intake/Output Summary (Last 24 hours) at 03/01/17 0755 Last data filed at 03/01/17 0300  Gross per 24 hour  Intake           359.83 ml  Output             1500 ml  Net         -1140.17 ml   Filed Weights   02/27/17 2052 02/28/17 0309 03/01/17 0330  Weight: 87.3 kg (192 lb 8 oz) 86.2 kg (190 lb 1.6 oz) 85.6 kg (188 lb 11.4 oz)    Examination:  General: Not in pain or dyspnea, mild deconditioned Neurology: Awake and alert, non focal  E ENT: no pallor, no icterus, oral mucosa moist Cardiovascular: No JVD. S1-S2 present, rhythmic, no gallops, rubs, or murmurs. No lower extremity edema. Pulmonary: vesicular breath sounds bilaterally, adequate air movement, no wheezing, rhonchi or rales. Gastrointestinal. Abdomen flat, no organomegaly, non tender, no rebound or guarding  Skin. No rashes Musculoskeletal: no joint deformities     Data Reviewed: I have personally reviewed following labs and imaging studies  CBC:  Recent Labs Lab 02/25/17 1647 02/26/17 0519 02/27/17 0331 02/28/17 0349  WBC 9.3 13.3* 17.2* 10.5  HGB 14.8 14.7 14.5 14.2  HCT 44.0 44.2 42.5 44.1  MCV 88.2 86.8 87.4 88.6  PLT 219 252 265 286   Basic Metabolic Panel:  Recent Labs Lab  02/25/17 1647 02/26/17 0519 02/27/17 0331 02/28/17 0349 03/01/17 0137  NA 137 137 131* 137 136  K 4.0 3.4* 3.0* 3.3* 3.6  CL 104 101 96* 100* 99*  CO2 21* GLUCOSE 157* 165* 184* 149* 151*  BUN CREATININE 0.85 0.81 1.01 1.07 1.08  CALCIUM 9.4 9.0 8.8* 9.5 9.3  MG  --  1.5* 1.9 1.8  --   PHOS  --  3.3 3.1 2.6  --    GFR: Estimated Creatinine Clearance: 63.7 mL/min (by C-G formula based on SCr of 1.08 mg/dL). Liver Function Tests:  Recent Labs Lab 02/25/17 1647  AST 37  ALT 46  ALKPHOS 61  BILITOT 0.9  PROT 7.1  ALBUMIN 4.1    Recent Labs Lab 02/27/17 1949  LIPASE 33   No results for input(s): AMMONIA in the last 168 hours. Coagulation Profile:  Recent Labs Lab 02/25/17 1647  INR 0.98   Cardiac Enzymes:  Recent Labs Lab 02/26/17 0519 02/26/17 1156 02/27/17 0331 02/27/17 1625 02/28/17 0349  TROPONINI <0.03 <0.03 0.03* 0.05* 0.03*   BNP (last 3 results) No results for input(s): PROBNP in the last 8760 hours. HbA1C:  Recent Labs  03/01/17 0137  HGBA1C 6.5*   CBG:  Recent Labs Lab 02/25/17 1646 02/25/17 2134  GLUCAP 159* 174*   Lipid Profile:  Recent Labs  02/26/17 1156  CHOL 235*  HDL 42  LDLCALC 149*  TRIG 218*  CHOLHDL 5.6   Thyroid Function Tests: No results for input(s): TSH, T4TOTAL, FREET4, T3FREE, THYROIDAB in the last 72 hours. Anemia Panel: No results for input(s): VITAMINB12, FOLATE, FERRITIN, TIBC, IRON, RETICCTPCT in the last 72 hours.    Radiology Studies: I have reviewed all of the imaging during this hospital visit personally     Scheduled Meds: . amLODipine  10 mg Oral Daily  . atorvastatin  80 mg Oral q1800  . chlorthalidone  25 mg Oral Daily  . clopidogrel  75 mg Oral Daily  . heparin  5,000 Units Subcutaneous Q8H  . lisinopril  10 mg Oral Daily  . pantoprazole  40 mg Oral Q1200   Continuous Infusions: . sodium chloride    . sodium chloride 10 mL/hr at 02/27/17 1900  .  chlorproMAZINE (THORAZINE) IV Stopped (02/27/17 1216)     LOS: 4 days        Mauricio Annett Gula, MD [facility] Pager [phone]

## 2017-03-02 DIAGNOSIS — E119 Type 2 diabetes mellitus without complications: Secondary | ICD-10-CM

## 2017-03-02 LAB — BASIC METABOLIC PANEL
Anion gap: 8 (ref 5–15)
BUN: 18 mg/dL (ref 6–20)
CO2: 29 mmol/L (ref 22–32)
CREATININE: 1 mg/dL (ref 0.61–1.24)
Calcium: 9.4 mg/dL (ref 8.9–10.3)
Chloride: 99 mmol/L — ABNORMAL LOW (ref 101–111)
GFR calc Af Amer: >60 mL/min (ref >60)
GFR calc non Af Amer: >60 mL/min (ref >60)
Glucose, Bld: 169 mg/dL — ABNORMAL HIGH (ref 65–99)
Potassium: 4 mmol/L (ref 3.5–5.1)
SODIUM: 136 mmol/L (ref 135–145)

## 2017-03-02 MED ORDER — ATORVASTATIN CALCIUM 80 MG PO TABS: 80.0000 mg | ORAL_TABLET | Freq: Every day | ORAL | 0 refills | Status: DC

## 2017-03-02 MED ORDER — BENAZEPRIL HCL 10 MG PO TABS: 10.0000 mg | ORAL_TABLET | Freq: Every day | ORAL | 0 refills | Status: DC

## 2017-03-02 MED ORDER — AMLODIPINE BESYLATE 10 MG PO TABS: 10.0000 mg | ORAL_TABLET | Freq: Every day | ORAL | 0 refills | Status: DC

## 2017-03-02 MED ORDER — METFORMIN HCL 500 MG PO TABS: 500.0000 mg | ORAL_TABLET | Freq: Every day | ORAL | 0 refills | Status: DC

## 2017-03-02 NOTE — Progress Notes (Signed)
Physical Therapy Treatment Patient Details Name: James Hall MRN: [id number] DOB: [birth year] Today's Date: 03/02/2017    History of Present Illness Pt adm with Hypertensive emergency, with hypertensive encephalopathy. Pt with c/o rt side weakness that resolved. CT and MRI negative for acute abnormalities. PMH - HTN, CAD    PT Comments    Patient continues to be impulsive with mobility putting pt at increased risk for falls. Tolerated gait training with RW for support-encouraged pt to use RW at all times at home and to slow down. Pt with LOB when trying to get to RW requiring assist for support to prevent fall. Pt with poor awareness of balance/deficits. Will continue to follow.   Follow Up Recommendations  Home health PT;Supervision for mobility/OOB     Equipment Recommendations  Rolling walker with 5" wheels    Recommendations for Other Services       Precautions / Restrictions Precautions Precautions: Fall Restrictions Weight Bearing Restrictions: No    Mobility  Bed Mobility               General bed mobility comments: Pt up in chair  Transfers Overall transfer level: Needs assistance Equipment used: Rolling walker (2 wheeled) Transfers: Sit to/from Stand Sit to Stand: Min guard         General transfer comment: Assist for safety due to poor balance.   Ambulation/Gait Ambulation/Gait assistance: Min assist Ambulation Distance (Feet): 250 Feet Assistive device: Rolling walker (2 wheeled);1 person hand held assist Gait Pattern/deviations: Step-through pattern;Decreased stride length;Wide base of support;Staggering left Gait velocity: At times too fast for safety   General Gait Details: Pt with LOB initially when trying to move quickly without RW requiring assist. Min A for balance. USed RW for support, leaning left with staggering towards left as well. Cues to decrease speed.    Stairs            Wheelchair Mobility    Modified Rankin  (Stroke Patients Only) Modified Rankin (Stroke Patients Only) Pre-Morbid Rankin Score: No symptoms Modified Rankin: Moderately severe disability     Balance Overall balance assessment: Needs assistance Sitting-balance support: No upper extremity supported;Feet supported Sitting balance-Leahy Scale: Good     Standing balance support: During functional activity Standing balance-Leahy Scale: Fair Standing balance comment: Able to stand statically without UE support with mild sway noted. Stood for ~3 mins when going over dc paperwork.                            Cognition Arousal/Alertness: Awake/alert Behavior During Therapy: Impulsive Overall Cognitive Status: Within Functional Limits for tasks assessed                                 General Comments: poor awareness of deficits.       Exercises      General Comments General comments (skin integrity, edema, etc.): VSS.      Pertinent Vitals/Pain Pain Assessment: No/denies pain    Home Living                      Prior Function            PT Goals (current goals can now be found in the care plan section) Progress towards PT goals: Progressing toward goals    Frequency    Min 3X/week      PT Plan  Co-evaluation              AM-PAC PT "6 Clicks" Daily Activity  Outcome Measure  Difficulty turning over in bed (including adjusting bedclothes, sheets and blankets)?: None Difficulty moving from lying on back to sitting on the side of the bed? : None Difficulty sitting down on and standing up from a chair with arms (e.g., wheelchair, bedside commode, etc,.)?: None Help needed moving to and from a bed to chair (including a wheelchair)?: A Little Help needed walking in hospital room?: A Little Help needed climbing 3-5 steps with a railing? : A Little 6 Click Score: 21    End of Session Equipment Utilized During Treatment: Gait belt Activity Tolerance: Patient  tolerated treatment well Patient left: in chair;with call bell/phone within reach;with nursing/sitter in room (tech helping pt get dressed) Nurse Communication: Mobility status PT Visit Diagnosis: Unsteadiness on feet (R26.81);Difficulty in walking, not elsewhere classified (R26.2)     Time: 1610-9604 PT Time Calculation (min) (ACUTE ONLY): 16 min  Charges:  $Gait Training: 8-22 mins                    G Codes:       James Hall, PT, DPT [phone]     James Hall 03/02/2017, 9:27 AM

## 2017-03-02 NOTE — Discharge Summary (Signed)
Physician Discharge Summary  James Hall ZOX:[id number] DOB: 01-16-1946 DOA: 02/25/2017  PCP: Lenell Antu, DO  Admit date: 02/25/2017 Discharge date: 03/02/2017  Admitted From: Home Disposition:  Home  Recommendations for Outpatient Follow-up:  1. Follow up with PCP in 1- week 2. Metoprolol has been changed to Amlodipine 3. Benazepril was added. 4. Atorvastatin has been increased to 80 mg daily 5. Patient diagnosed with type 2 diabetes mellitus, and started on metformin.   Home Health: yes  Equipment/Devices: Walker   Discharge Condition:Stable CODE STATUS: Full  Diet recommendation: Cardiac prudent diet  Brief/Interim Summary: 71 year old male who presented with elevated blood pressure. Patient has significant history of hypertension, coronary artery disease and dyslipidemia. On the day of admission he suddenly developed paresthesias in his right arm, associated with a facial droop. On initial physical examination his blood pressure was 248/79, temperature 97.7, heart rate 51, respiratory rate 14, oxygen saturation 97%, patient was alert, positive facial droop, right arm weakness, lungs clear to auscultation bilaterally, heart S1-S2 present bradycardic, no gallops, rubs or murmurs, the abdomen was soft nontender, no extremity edema. Sodium 137, potassium 4.0, chloride 104, bicarbonate 21, glucose 157, BUN 13, creatinine 0.85, white cell count 9.3, hemoglobin 14.8, hematocrit 44.0, platelets 219, INR 0.98. Head CT no acute changes. Chest x-ray, well inspirated, well penetrated, no significant rotation, increase interstitial markings bilaterally, positive cephalization of the vasculature, no effusions. No infiltrates. EKG was sinus rhythm, normal axis, normal intervals.  Patient was admitted to the hospital with the working diagnosis of hypertensive emergency, with hypertensive encephalopathy to rule out cerebrovascular accident.   1. Hypertensive emergency, complicated by hypertensive  encephalopathy. Patient was admitted to intensive care unit, he was placed on nicardipine drip with good toleration. His antihypertensive agents were modified, ACE inhibitor was added, he was continued on chlorthalidone. He was successfully wean off nicardipine drip. Metoprolol was changed to amlodipine to avoid further bradycardia, and to improved blood pressure control. Patient had further workup for cerebrovascular accident which was ruled out, MRI of the head with no signs of ischemia, echocardiography with ejection fraction 60-65%, on the left ventricle. CT angiography negative for large vessel occlusion. Moderate intracranial atherosclerotic changes, most notable within the MCA and PCA bilaterally. Patient was continued on atorvastatin, dose was increased to 80 mg, continue antiplatelet therapy with clopidogrel.  2. Type 2 diabetes mellitus. Patient's fasting glucose remained above 120, hemoglobin A1c was 6.5%, consistent with diagnosis of diabetes mellitus. Patient will be discharged on metformin, and close follow-up as an outpatient.  3. Dyslipidemia.  Continue hypodensity statin, at high dose, lipid profile with cholesterol 235, HDL 42, LDL 149, triglycerides 218.   4. Ambulatory dysfunction. Patient was seen by physical therapy, with recommendations for home health, rolling walker.  Discharge Diagnoses:  Active Problems:   Hypertensive emergency   Mixed hyperlipidemia   Hypertensive encephalopathy   Prediabetes   Leukocytosis    Discharge Instructions   Allergies as of 03/02/2017      Reactions   Penicillins Rash   Has patient had a PCN reaction causing immediate rash, facial/tongue/throat swelling, SOB or lightheadedness with hypotension: No Has patient had a PCN reaction causing severe rash involving mucus membranes or skin necrosis: No Has patient had a PCN reaction that required hospitalization: No Has patient had a PCN reaction occurring within the last 10 years: No If all  of the above answers are "NO", then may proceed with Cephalosporin use.      Medication List    STOP  taking these medications   metoprolol succinate 25 MG 24 hr tablet Commonly known as:  TOPROL-XL     TAKE these medications   amLODipine 10 MG tablet Commonly known as:  NORVASC Take 1 tablet (10 mg total) by mouth daily.   atorvastatin 80 MG tablet Commonly known as:  LIPITOR Take 1 tablet (80 mg total) by mouth daily at 6 PM. What changed:  medication strength  how much to take  when to take this   benazepril 10 MG tablet Commonly known as:  LOTENSIN Take 1 tablet (10 mg total) by mouth daily.   chlorthalidone 25 MG tablet Commonly known as:  HYGROTON Take 1 tablet (25 mg total) by mouth daily.   clopidogrel 75 MG tablet Commonly known as:  PLAVIX Take 75 mg by mouth daily.   metFORMIN 500 MG tablet Commonly known as:  GLUCOPHAGE Take 1 tablet (500 mg total) by mouth daily with breakfast.   vitamin B-12 100 MCG tablet Commonly known as:  CYANOCOBALAMIN Take 100 mcg by mouth daily. Gummie            Durable Medical Equipment        Start     Ordered   03/01/17 1355  For home use only DME Walker rolling  Once    Question:  Patient needs a walker to treat with the following condition  Answer:  Weakness   03/01/17 1355       Discharge Care Instructions        Start     Ordered   03/02/17 0000  amLODipine (NORVASC) 10 MG tablet  Daily     03/02/17 0831   03/02/17 0000  atorvastatin (LIPITOR) 80 MG tablet  Daily-1800     03/02/17 0831   03/02/17 0000  benazepril (LOTENSIN) 10 MG tablet  Daily     03/02/17 0831   03/02/17 0000  Increase activity slowly     03/02/17 0831   03/02/17 0000  Diet - low sodium heart healthy     03/02/17 0831   03/02/17 0000  Walker rolling     03/02/17 0831   03/02/17 0000  metFORMIN (GLUCOPHAGE) 500 MG tablet  Daily with breakfast     03/02/17 0831     Follow-up Information    [facility] Follow  up.   Specialty:  Home Health Services Why:  Physical Therapy Contact information: [address] [place] [address] [phone]          Allergies  Allergen Reactions  . Penicillins Rash    Has patient had a PCN reaction causing immediate rash, facial/tongue/throat swelling, SOB or lightheadedness with hypotension: No Has patient had a PCN reaction causing severe rash involving mucus membranes or skin necrosis: No Has patient had a PCN reaction that required hospitalization: No Has patient had a PCN reaction occurring within the last 10 years: No If all of the above answers are "NO", then may proceed with Cephalosporin use.    Consultations:  Neurology    Procedures/Studies: Ct Angio Head W Or Wo Contrast  Result Date: 02/26/2017 CLINICAL DATA:  Initial evaluation for TIA, right-sided weakness with headache. Hypertension. EXAM: CT ANGIOGRAPHY HEAD AND NECK TECHNIQUE: Multidetector CT imaging of the head and neck was performed using the standard protocol during bolus administration of intravenous contrast. Multiplanar CT image reconstructions and MIPs were obtained to evaluate the vascular anatomy. Carotid stenosis measurements (when applicable) are obtained utilizing NASCET criteria, using the distal internal carotid  diameter as the denominator. CONTRAST:  50 cc of Isovue 370. COMPARISON:  Prior CT and MRI from 02/25/2017. FINDINGS: CTA NECK FINDINGS Aortic arch: Partially visualized aortic arch of normal caliber with normal branch pattern. No flow-limiting stenosis about the origin of the great vessels. Visualized subclavian artery is widely patent. Right carotid system: Right common carotid artery mildly irregular but widely patent from its origin to the bifurcation without stenosis. Mild atheromatous plaque about the right bifurcation without stenosis. Right ICA patent from the bifurcation to the skullbase without stenosis, dissection, or occlusion. Left carotid  system: Left common carotid artery widely patent from its origin to the bifurcation. Relatively mild atheromatous narrowing at the proximal left ICA with associated stenosis of approximately 25% by NASCET criteria. Left ICA patent distally to the skullbase without stenosis, dissection, or occlusion. Vertebral arteries: Both of the vertebral arteries arise from the subclavian arteries. Focal plaque at the origin of the right vertebral artery with approximate and 65% stenosis. Right vertebral artery slightly dominant. Scattered left V2 atheromatous irregularity without flow-limiting stenosis. Vertebral artery's patent within the neck without high-grade or critical stenosis. Skeleton: No acute osseus abnormality. No worrisome lytic or blastic osseous lesions. Extensive bridging anterior osteophytic spurring seen throughout the cervical spine. OPLL seen extending from C4-5 through C7-T1. Other neck: No acute soft tissue abnormality within the neck. Salivary glands normal. Thyroid normal. No adenopathy. Upper chest: Visualized upper chest demonstrates no acute abnormality. Visualized lungs are grossly clear. Review of the MIP images confirms the above findings CTA HEAD FINDINGS Anterior circulation: Petrous ICAs widely patent bilaterally. Focal approximate moderate 50% stenosis noted at the proximal aspect of the cavernous right ICA (series 8, image 137). Scattered atheromatous plaque present within the remaining cavernous/supraclinoid ICAs bilaterally without flow-limiting stenosis. ICA termini widely patent. A1 segments irregular but patent without high-grade stenosis. Normal anterior communicating artery. Bilateral A2 atheromatous regularity without flow-limiting stenosis. Atheromatous irregularity within the M1 segments bilaterally, left worse than right. No high-grade flow-limiting stenosis. No proximal M2 occlusion. Distal branches atheromatous regularity bilaterally, also worse on the left. Posterior circulation:  Vertebral artery's patent to the vertebrobasilar junction without flow-limiting stenosis. Right vertebral artery dominant. Hypoplastic left vertebral artery mildly irregular. Posterior inferior cerebral arteries patent bilaterally. The basilar artery widely patent to its distal aspect. Superior cerebral arteries patent bilaterally. Both of the posterior cerebral artery supplied via the basilar and are widely patent to their distal aspects. Moderate atheromatous irregularity within the PCAs bilaterally, right slightly worse than left. No made of a superimposed short-segment mild to moderate proximal right P2 stenosis (series 11, image 25). Venous sinuses: Patent. Anatomic variants: None significant. Delayed phase: Not performed. Review of the MIP images confirms the above findings IMPRESSION: 1. Negative CTA for large vessel occlusion. No high-grade or correctable stenosis. 2. Moderate intracranial atherosclerotic changes, most notable within the MCAs and PCAs bilaterally, greatest within the left MCA distribution. 3. Short-segment moderate approximate 50% stenosis at the proximal cavernous right ICA. 4. Atheromatous plaque at the origin of the dominant right vertebral artery with approximately 65% short-segment stenosis. Vertebral arteries otherwise widely patent within the neck. 5. Atheromatous plaque about the carotid bifurcations bilaterally without hemodynamically significant stenosis, left slightly worse than right. Electronically Signed   By: Rise Mu M.D.   On: 02/26/2017 19:03   Dg Chest 2 View  Result Date: 02/25/2017 CLINICAL DATA:  Patient with weakness.  Arm tingling. EXAM: CHEST  2 VIEW COMPARISON:  None. FINDINGS: Normal cardiac and mediastinal contours. No consolidative  pulmonary opacities. No pleural effusion or pneumothorax. Displaced likely old left mid clavicle fracture. Thoracic spine degenerative changes. IMPRESSION: Displace likely old left clavicle fracture. No acute  cardiopulmonary process. Electronically Signed   By: Annia Belt M.D.   On: 02/25/2017 17:59   Ct Head Wo Contrast  Result Date: 02/25/2017 CLINICAL DATA:  Dizziness and lightheadedness beginning this morning. EXAM: CT HEAD WITHOUT CONTRAST TECHNIQUE: Contiguous axial images were obtained from the base of the skull through the vertex without intravenous contrast. COMPARISON:  None. FINDINGS: BRAIN: No intraparenchymal hemorrhage, mass effect nor midline shift. The ventricles and sulci are normal for age. Patchy supratentorial white matter hypodensities less than expected for patient's age, though non-specific are most compatible with chronic small vessel ischemic disease. No acute large vascular territory infarcts. No abnormal extra-axial fluid collections. Basal cisterns are patent. VASCULAR: Trace calcific atherosclerosis of the carotid siphons. SKULL: No skull fracture. Old bilateral nasal bone fractures. No significant scalp soft tissue swelling. SINUSES/ORBITS: Chronic LEFT maxillary sinusitis. Old LEFT orbital floor fracture and anterior maxillary wall fracture with cerclage wires. Trace paranasal sinus mucosal thickening. The included ocular globes and orbital contents are non-suspicious. OTHER: None. IMPRESSION: 1. No acute intracranial process. Negative noncontrast CT HEAD for age. 2. Old facial/orbital fractures with chronic LEFT maxillary sinusitis. Electronically Signed   By: Awilda Metro M.D.   On: 02/25/2017 18:18   Ct Angio Neck W Or Wo Contrast  Result Date: 02/26/2017 CLINICAL DATA:  Initial evaluation for TIA, right-sided weakness with headache. Hypertension. EXAM: CT ANGIOGRAPHY HEAD AND NECK TECHNIQUE: Multidetector CT imaging of the head and neck was performed using the standard protocol during bolus administration of intravenous contrast. Multiplanar CT image reconstructions and MIPs were obtained to evaluate the vascular anatomy. Carotid stenosis measurements (when applicable) are  obtained utilizing NASCET criteria, using the distal internal carotid diameter as the denominator. CONTRAST:  50 cc of Isovue 370. COMPARISON:  Prior CT and MRI from 02/25/2017. FINDINGS: CTA NECK FINDINGS Aortic arch: Partially visualized aortic arch of normal caliber with normal branch pattern. No flow-limiting stenosis about the origin of the great vessels. Visualized subclavian artery is widely patent. Right carotid system: Right common carotid artery mildly irregular but widely patent from its origin to the bifurcation without stenosis. Mild atheromatous plaque about the right bifurcation without stenosis. Right ICA patent from the bifurcation to the skullbase without stenosis, dissection, or occlusion. Left carotid system: Left common carotid artery widely patent from its origin to the bifurcation. Relatively mild atheromatous narrowing at the proximal left ICA with associated stenosis of approximately 25% by NASCET criteria. Left ICA patent distally to the skullbase without stenosis, dissection, or occlusion. Vertebral arteries: Both of the vertebral arteries arise from the subclavian arteries. Focal plaque at the origin of the right vertebral artery with approximate and 65% stenosis. Right vertebral artery slightly dominant. Scattered left V2 atheromatous irregularity without flow-limiting stenosis. Vertebral artery's patent within the neck without high-grade or critical stenosis. Skeleton: No acute osseus abnormality. No worrisome lytic or blastic osseous lesions. Extensive bridging anterior osteophytic spurring seen throughout the cervical spine. OPLL seen extending from C4-5 through C7-T1. Other neck: No acute soft tissue abnormality within the neck. Salivary glands normal. Thyroid normal. No adenopathy. Upper chest: Visualized upper chest demonstrates no acute abnormality. Visualized lungs are grossly clear. Review of the MIP images confirms the above findings CTA HEAD FINDINGS Anterior circulation:  Petrous ICAs widely patent bilaterally. Focal approximate moderate 50% stenosis noted at the proximal aspect of the  cavernous right ICA (series 8, image 137). Scattered atheromatous plaque present within the remaining cavernous/supraclinoid ICAs bilaterally without flow-limiting stenosis. ICA termini widely patent. A1 segments irregular but patent without high-grade stenosis. Normal anterior communicating artery. Bilateral A2 atheromatous regularity without flow-limiting stenosis. Atheromatous irregularity within the M1 segments bilaterally, left worse than right. No high-grade flow-limiting stenosis. No proximal M2 occlusion. Distal branches atheromatous regularity bilaterally, also worse on the left. Posterior circulation: Vertebral artery's patent to the vertebrobasilar junction without flow-limiting stenosis. Right vertebral artery dominant. Hypoplastic left vertebral artery mildly irregular. Posterior inferior cerebral arteries patent bilaterally. The basilar artery widely patent to its distal aspect. Superior cerebral arteries patent bilaterally. Both of the posterior cerebral artery supplied via the basilar and are widely patent to their distal aspects. Moderate atheromatous irregularity within the PCAs bilaterally, right slightly worse than left. No made of a superimposed short-segment mild to moderate proximal right P2 stenosis (series 11, image 25). Venous sinuses: Patent. Anatomic variants: None significant. Delayed phase: Not performed. Review of the MIP images confirms the above findings IMPRESSION: 1. Negative CTA for large vessel occlusion. No high-grade or correctable stenosis. 2. Moderate intracranial atherosclerotic changes, most notable within the MCAs and PCAs bilaterally, greatest within the left MCA distribution. 3. Short-segment moderate approximate 50% stenosis at the proximal cavernous right ICA. 4. Atheromatous plaque at the origin of the dominant right vertebral artery with approximately  65% short-segment stenosis. Vertebral arteries otherwise widely patent within the neck. 5. Atheromatous plaque about the carotid bifurcations bilaterally without hemodynamically significant stenosis, left slightly worse than right. Electronically Signed   By: Rise Mu M.D.   On: 02/26/2017 19:03   Mr Brain Wo Contrast  Result Date: 02/26/2017 CLINICAL DATA:  71 y/o M; dizziness and slight nausea. History of TIA. EXAM: MRI HEAD WITHOUT CONTRAST TECHNIQUE: Multiplanar, multiecho pulse sequences of the brain and surrounding structures were obtained without intravenous contrast. COMPARISON:  02/25/2017 CT head. FINDINGS: Brain: No acute infarction, hemorrhage, hydrocephalus, extra-axial collection or mass lesion. Few nonspecific foci of T2 FLAIR hyperintense signal abnormality in subcortical and periventricular white matter are compatible with mild chronic microvascular ischemic changes for age. Mild brain parenchymal volume loss. Punctate focus of susceptibility hypo intensities within right posterior parietal lobe likely represents hemosiderin deposition of chronic microhemorrhage. Vascular: Normal flow voids. Skull and upper cervical spine: Normal marrow signal. Sinuses/Orbits: Chronic left orbital and zygomatic complex fracture the surgical hardware creating susceptibility artifact partially obscuring the region on several sequences. Left maxillary sinus mucosal thickening. Otherwise negative. Other: None. IMPRESSION: 1. No acute intracranial abnormality. 2. Mild chronic microvascular ischemic changes and mild parenchymal volume loss of the brain. 3. Chronic left orbital and zygomatic complex fracture as well as left maxillary sinus mucosal thickening as seen on prior CT. Electronically Signed   By: Mitzi Hansen M.D.   On: 02/26/2017 00:51   Ct Abdomen Pelvis W Contrast  Result Date: 02/27/2017 CLINICAL DATA:  Upper abdominal pain.  Uncontrollable hiccups. EXAM: CT ABDOMEN AND PELVIS  WITH CONTRAST TECHNIQUE: Multidetector CT imaging of the abdomen and pelvis was performed using the standard protocol following bolus administration of intravenous contrast. CONTRAST:  100 cc Isovue-300 intravenous COMPARISON:  None. FINDINGS: Lower chest: Borderline heart size. Coronary atherosclerotic calcification. Minimal atelectatic changes. Hepatobiliary: Prominent hepatic steatosis, somewhat patchy. No underlying mass lesion is seen.Cholecystectomy. Normal common bile duct diameter. Pancreas: Unremarkable. Spleen: Unremarkable. Adrenals/Urinary Tract: Negative adrenals. No hydronephrosis or stone. Moderately distended urinary bladder without pathologic finding. Stomach/Bowel: No obstruction. No appendicitis. Colonic  diverticulosis, primarily at the sigmoid. Tiny hiatal hernia, sliding. Vascular/Lymphatic: No acute vascular abnormality. Aortic atherosclerosis. No mass or adenopathy. Reproductive:Mild generalized prostatic enlargement. Other: No ascites or pneumoperitoneum. Musculoskeletal: Prominent spondylosis with bulky osteophytes that cause multilevel lower thoracic and upper lumbar ankylosis. Osteophytes have also ankylosed to the sacroiliac joints. Sclerotic focus in the left ilium that has an overall benign appearance. No acute or aggressive finding. Hypertrophic facets causes spinal stenosis, advanced at L3-4 and L4-5. IMPRESSION: 1. No acute finding. 2. Prominent hepatic steatosis. 3. Colonic diverticulosis. 4.  Aortic Atherosclerosis (ICD10-I70.0).  Coronary atherosclerosis. 5. Prominent spondylosis with multilevel thoracic and upper lumbar ankylosis. Endplate and facet spurs cause lower lumbar spinal stenosis, severe at L4-5. Electronically Signed   By: Marnee Spring M.D.   On: 02/27/2017 20:18   Dg Chest Port 1 View  Result Date: 02/28/2017 CLINICAL DATA:  Acute respiratory failure. EXAM: PORTABLE CHEST 1 VIEW COMPARISON:  02/25/2017 FINDINGS: Mild right hemidiaphragm elevation. Remote  nonunited left clavicular fracture. Midline trachea. Borderline cardiomegaly. Mediastinal contours otherwise within normal limits. No pleural effusion or pneumothorax. Clear lungs. IMPRESSION: Borderline cardiomegaly, without acute disease. Electronically Signed   By: Jeronimo Greaves M.D.   On: 02/28/2017 08:55       Subjective: Patient feeling better, no nausea or vomiting, improved dizziness, no chest pain or palpitations.   Discharge Exam: Vitals:   03/02/17 0609 03/02/17 0747  BP: (!) 161/48 (!) 163/64  Pulse:  84  Resp: 12   Temp:  97.8 F (36.6 C)  SpO2:  94%   Vitals:   03/02/17 0014 03/02/17 0608 03/02/17 0609 03/02/17 0747  BP: (!) 120/40  (!) 161/48 (!) 163/64  Pulse: 77 92  84  Resp: 10  12   Temp: 98.4 F (36.9 C) 98.2 F (36.8 C)  97.8 F (36.6 C)  TempSrc: Oral Oral  Oral  SpO2: 97% 97%  94%  Weight:   85.6 kg (188 lb 12.8 oz)   Height:        General: Pt is alert, awake, not in acute distress E ENT, no pallor or icterus Cardiovascular: RRR, S1/S2 +, no rubs, no gallops Respiratory: CTA bilaterally, no wheezing, no rhonchi Abdominal: Soft, NT, ND, bowel sounds + Extremities: no edema, no cyanosis    The results of significant diagnostics from this hospitalization (including imaging, microbiology, ancillary and laboratory) are listed below for reference.     Microbiology: Recent Results (from the past 240 hour(s))  MRSA PCR Screening     Status: None   Collection Time: 02/25/17  9:20 PM  Result Value Ref Range Status   MRSA by PCR NEGATIVE NEGATIVE Final    Comment:        The GeneXpert MRSA Assay (FDA approved for NASAL specimens only), is one component of a comprehensive MRSA colonization surveillance program. It is not intended to diagnose MRSA infection nor to guide or monitor treatment for MRSA infections.      Labs: BNP (last 3 results) No results for input(s): BNP in the last 8760 hours. Basic Metabolic Panel:  Recent Labs Lab  02/26/17 0519 02/27/17 0331 02/28/17 0349 03/01/17 0137 03/02/17 0213  NA 137 131* 137 136 136  K 3.4* 3.0* 3.3* 3.6 4.0  CL 101 96* 100* 99* 99*  CO2 GLUCOSE 165* 184* 149* 151* 169*  BUN CREATININE 0.81 1.01 1.07 1.08 1.00  CALCIUM 9.0 8.8* 9.5 9.3 9.4  MG 1.5* 1.9  1.8  --   --   PHOS 3.3 3.1 2.6  --   --    Liver Function Tests:  Recent Labs Lab 02/25/17 1647  AST 37  ALT 46  ALKPHOS 61  BILITOT 0.9  PROT 7.1  ALBUMIN 4.1    Recent Labs Lab 02/27/17 1949  LIPASE 33   No results for input(s): AMMONIA in the last 168 hours. CBC:  Recent Labs Lab 02/25/17 1647 02/26/17 0519 02/27/17 0331 02/28/17 0349  WBC 9.3 13.3* 17.2* 10.5  HGB 14.8 14.7 14.5 14.2  HCT 44.0 44.2 42.5 44.1  MCV 88.2 86.8 87.4 88.6  PLT 219 252 265 286   Cardiac Enzymes:  Recent Labs Lab 02/26/17 0519 02/26/17 1156 02/27/17 0331 02/27/17 1625 02/28/17 0349  TROPONINI <0.03 <0.03 0.03* 0.05* 0.03*   BNP: Invalid input(s): POCBNP CBG:  Recent Labs Lab 02/25/17 1646 02/25/17 2134  GLUCAP 159* 174*   D-Dimer No results for input(s): DDIMER in the last 72 hours. Hgb A1c  Recent Labs  03/01/17 0137  HGBA1C 6.5*   Lipid Profile No results for input(s): CHOL, HDL, LDLCALC, TRIG, CHOLHDL, LDLDIRECT in the last 72 hours. Thyroid function studies No results for input(s): TSH, T4TOTAL, T3FREE, THYROIDAB in the last 72 hours.  Invalid input(s): FREET3 Anemia work up No results for input(s): VITAMINB12, FOLATE, FERRITIN, TIBC, IRON, RETICCTPCT in the last 72 hours. Urinalysis No results found for: COLORURINE, APPEARANCEUR, LABSPEC, PHURINE, GLUCOSEU, HGBUR, BILIRUBINUR, KETONESUR, PROTEINUR, UROBILINOGEN, NITRITE, LEUKOCYTESUR Sepsis Labs Invalid input(s): PROCALCITONIN,  WBC,  LACTICIDVEN Microbiology Recent Results (from the past 240 hour(s))  MRSA PCR Screening     Status: None   Collection Time: 02/25/17  9:20 PM  Result Value Ref  Range Status   MRSA by PCR NEGATIVE NEGATIVE Final    Comment:        The GeneXpert MRSA Assay (FDA approved for NASAL specimens only), is one component of a comprehensive MRSA colonization surveillance program. It is not intended to diagnose MRSA infection nor to guide or monitor treatment for MRSA infections.      Time coordinating discharge: 45 minutes  SIGNED:   Coralie Keens, MD  [facility] 03/02/2017, 8:13 AM Pager [phone]  If 7PM-7AM, please contact night-coverage [email or web address] Password [email or web address]

## 2017-03-02 NOTE — Plan of Care (Signed)
Problem: Safety: Goal: Ability to remain free from injury will improve Outcome: Progressing Bed alarm on, call light and personal items within reach.  Pt encouraged to call staff for assistance getting out of bed.  Problem: Pain Managment: Goal: General experience of comfort will improve Outcome: Progressing Denies c/o pain or discomfort.  Problem: Physical Regulation: Goal: Ability to maintain clinical measurements within normal limits will improve Outcome: Progressing VSS. Goal: Will remain free from infection Outcome: Progressing No s/s of infection noted.

## 2017-03-02 NOTE — Progress Notes (Signed)
OT Cancellation Note  Patient Details Name: James Hall MRN: [id number] DOB: Jan 13, 1946   Cancelled Treatment:    Reason Eval/Treat Not Completed: Other (comment). Per nsg, pt in process of being DC home. Will defer OT to next venue of care.   [facility] Kadeen Sroka, OT/L  [phone] 03/02/2017 03/02/2017, 11:00 AM

## 2017-03-03 DIAGNOSIS — E119 Type 2 diabetes mellitus without complications: Secondary | ICD-10-CM | POA: Diagnosis not present

## 2017-03-03 DIAGNOSIS — I10 Essential (primary) hypertension: Secondary | ICD-10-CM | POA: Diagnosis not present

## 2017-03-03 DIAGNOSIS — M456 Ankylosing spondylitis lumbar region: Secondary | ICD-10-CM | POA: Diagnosis not present

## 2017-03-03 DIAGNOSIS — M4304 Spondylolysis, thoracic region: Secondary | ICD-10-CM | POA: Diagnosis not present

## 2017-03-03 DIAGNOSIS — M48061 Spinal stenosis, lumbar region without neurogenic claudication: Secondary | ICD-10-CM | POA: Diagnosis not present

## 2017-03-03 DIAGNOSIS — I700 Atherosclerosis of aorta: Secondary | ICD-10-CM | POA: Diagnosis not present

## 2017-03-03 DIAGNOSIS — Z7902 Long term (current) use of antithrombotics/antiplatelets: Secondary | ICD-10-CM | POA: Diagnosis not present

## 2017-03-03 DIAGNOSIS — Z7984 Long term (current) use of oral hypoglycemic drugs: Secondary | ICD-10-CM | POA: Diagnosis not present

## 2017-03-03 DIAGNOSIS — E782 Mixed hyperlipidemia: Secondary | ICD-10-CM | POA: Diagnosis not present

## 2017-03-03 DIAGNOSIS — I2510 Atherosclerotic heart disease of native coronary artery without angina pectoris: Secondary | ICD-10-CM | POA: Diagnosis not present

## 2017-03-03 DIAGNOSIS — K5732 Diverticulitis of large intestine without perforation or abscess without bleeding: Secondary | ICD-10-CM | POA: Diagnosis not present

## 2017-03-04 DIAGNOSIS — E782 Mixed hyperlipidemia: Secondary | ICD-10-CM | POA: Diagnosis not present

## 2017-03-04 DIAGNOSIS — Z09 Encounter for follow-up examination after completed treatment for conditions other than malignant neoplasm: Secondary | ICD-10-CM | POA: Diagnosis not present

## 2017-03-04 DIAGNOSIS — E119 Type 2 diabetes mellitus without complications: Secondary | ICD-10-CM | POA: Diagnosis not present

## 2017-03-04 DIAGNOSIS — I10 Essential (primary) hypertension: Secondary | ICD-10-CM | POA: Diagnosis not present

## 2017-03-04 DIAGNOSIS — H539 Unspecified visual disturbance: Secondary | ICD-10-CM | POA: Diagnosis not present

## 2017-03-04 DIAGNOSIS — R2681 Unsteadiness on feet: Secondary | ICD-10-CM | POA: Diagnosis not present

## 2017-03-04 DIAGNOSIS — I2510 Atherosclerotic heart disease of native coronary artery without angina pectoris: Secondary | ICD-10-CM | POA: Diagnosis not present

## 2017-03-08 DIAGNOSIS — I10 Essential (primary) hypertension: Secondary | ICD-10-CM | POA: Diagnosis not present

## 2017-03-08 DIAGNOSIS — E119 Type 2 diabetes mellitus without complications: Secondary | ICD-10-CM | POA: Diagnosis not present

## 2017-03-10 DIAGNOSIS — E119 Type 2 diabetes mellitus without complications: Secondary | ICD-10-CM | POA: Diagnosis not present

## 2017-03-10 DIAGNOSIS — I10 Essential (primary) hypertension: Secondary | ICD-10-CM | POA: Diagnosis not present

## 2017-03-17 DIAGNOSIS — I10 Essential (primary) hypertension: Secondary | ICD-10-CM | POA: Diagnosis not present

## 2017-03-17 DIAGNOSIS — E119 Type 2 diabetes mellitus without complications: Secondary | ICD-10-CM | POA: Diagnosis not present

## 2017-03-19 DIAGNOSIS — E119 Type 2 diabetes mellitus without complications: Secondary | ICD-10-CM | POA: Diagnosis not present

## 2017-03-19 DIAGNOSIS — I10 Essential (primary) hypertension: Secondary | ICD-10-CM | POA: Diagnosis not present

## 2017-03-22 DIAGNOSIS — I10 Essential (primary) hypertension: Secondary | ICD-10-CM | POA: Diagnosis not present

## 2017-03-22 DIAGNOSIS — E119 Type 2 diabetes mellitus without complications: Secondary | ICD-10-CM | POA: Diagnosis not present

## 2017-03-23 DIAGNOSIS — E119 Type 2 diabetes mellitus without complications: Secondary | ICD-10-CM | POA: Diagnosis not present

## 2017-03-23 DIAGNOSIS — I2510 Atherosclerotic heart disease of native coronary artery without angina pectoris: Secondary | ICD-10-CM | POA: Diagnosis not present

## 2017-03-23 DIAGNOSIS — I2583 Coronary atherosclerosis due to lipid rich plaque: Secondary | ICD-10-CM | POA: Diagnosis not present

## 2017-04-12 DIAGNOSIS — H2513 Age-related nuclear cataract, bilateral: Secondary | ICD-10-CM | POA: Diagnosis not present

## 2017-04-12 DIAGNOSIS — Z7984 Long term (current) use of oral hypoglycemic drugs: Secondary | ICD-10-CM | POA: Diagnosis not present

## 2017-04-12 DIAGNOSIS — E119 Type 2 diabetes mellitus without complications: Secondary | ICD-10-CM | POA: Diagnosis not present

## 2017-05-04 ENCOUNTER — Encounter: Payer: Self-pay | Admitting: Cardiology

## 2017-05-04 ENCOUNTER — Ambulatory Visit (INDEPENDENT_AMBULATORY_CARE_PROVIDER_SITE_OTHER): Payer: Medicare Other | Admitting: Cardiology

## 2017-05-04 VITALS — BP 162/82 | HR 84 | Ht 67.0 in | Wt 175.4 lb

## 2017-05-04 DIAGNOSIS — I10 Essential (primary) hypertension: Secondary | ICD-10-CM | POA: Diagnosis not present

## 2017-05-04 DIAGNOSIS — I2510 Atherosclerotic heart disease of native coronary artery without angina pectoris: Secondary | ICD-10-CM

## 2017-05-04 DIAGNOSIS — E7800 Pure hypercholesterolemia, unspecified: Secondary | ICD-10-CM | POA: Diagnosis not present

## 2017-05-04 NOTE — Patient Instructions (Signed)

## 2017-05-04 NOTE — Progress Notes (Signed)
Cardiology Office Note:    Date:  05/04/2017   ID:  James Hall, DOB 1945-10-03, MRN [id number]  PCP:  Lenell Antu, DO  Cardiologist:  Donato Schultz, MD    Referring MD: Lenell Antu, DO     History of Present Illness:    James Hall is a 71 y.o. male with a history of CAD s/p PCI with HTN, HL here to establish care. He had his stent placed in 2007. Has essential hypertension medications reviewed, also hyperlipidemia. He is a former smoker quit 1974. Enjoys yard maintenance, backyard projects.  He has not been having any chest pain, no significant shortness of breath, no fevers chills orthopnea PND syncope. He has been compliant with his medications. He has been on Plavix, likely for early generation drug-eluting stent.  Cypher stent He had seen Dr. Patty Sermons many many years ago.  05/04/17-he was admitted to the hospital on 02/28/17 with hypertensive emergency/hypertensive encephalopathy.  Medications were changed, stopped metoprolol, added amlodipine.  Overall his blood pressures at home have been in the 130s usually.  Today it was a little bit elevated.  No chest pain, no shortness of breath.  He has been feeling overall fatigue and lack of energy and increased worry especially about his wife had a 7% of her renal function.  He recently got a new mini Cooper.  MRI of brain reviewed, no atherosclerotic changes, echocardiogram showed normal ejection fraction.    Past Medical History:  Diagnosis Date  . Atherosclerotic heart disease of native coronary artery without angina pectoris   . Coronary arteriosclerosis due to lipid rich plaque   . Hypertension   . Mixed hyperlipidemia     Past Surgical History:  Procedure Laterality Date  . CAROTID STENT  2007    Current Medications: Current Meds  Medication Sig  . amLODipine (NORVASC) 10 MG tablet Take 1 tablet (10 mg total) by mouth daily.  Marland Kitchen atorvastatin (LIPITOR) 80 MG tablet Take 1 tablet (80 mg total) by mouth daily at 6  PM.  . benazepril (LOTENSIN) 10 MG tablet Take 1 tablet (10 mg total) by mouth daily.  . chlorthalidone (HYGROTON) 25 MG tablet Take 1 tablet (25 mg total) by mouth daily.  . clopidogrel (PLAVIX) 75 MG tablet Take 75 mg by mouth daily.  . metFORMIN (GLUCOPHAGE) 500 MG tablet Take 1 tablet (500 mg total) by mouth daily with breakfast.  . vitamin B-12 (CYANOCOBALAMIN) 100 MCG tablet Take 100 mcg by mouth daily. Gummie     Allergies:   Penicillins   Social History   Socioeconomic History  . Marital status: Married    Spouse name: None  . Number of children: None  . Years of education: None  . Highest education level: None  Social Needs  . Financial resource strain: None  . Food insecurity - worry: None  . Food insecurity - inability: None  . Transportation needs - medical: None  . Transportation needs - non-medical: None  Occupational History  . None  Tobacco Use  . Smoking status: Former Smoker    Types: Cigarettes    Last attempt to quit: 09/02/1972    Years since quitting: 44.6  . Smokeless tobacco: Never Used  Substance and Sexual Activity  . Alcohol use: Yes    Comment: RARELY  . Drug use: No  . Sexual activity: None  Other Topics Concern  . None  Social History Narrative  . None     Family History: The patient's family history includes Other  in his father. Father killed in war, shot in concentration camp.   ROS:   Please see the history of present illness.  Positive fatigue, headaches, all other systems reviewed and are negative.  EKGs/Labs/Other Studies Reviewed:    The following studies were reviewed today: Prior office records from Dr. Nedra HaiLee reviewed. Lab work reviewed, EKG reviewed.  Nuclear stress test 12/01/11 showed normal EF no ischemia  Cardiac catheterization on 05/20/2006-Cypher stent placement 2.75 x 33 in mid left circumflex. Otherwise 50% D1, 50% distal LAD, 90% mid left circumflex, 70% lateral, 50% distal RCA, 80% PDA, EF 65%.  EKG:  EKG is  ordered today.  The ekg ordered today demonstrates sinus rhythm 72 other abnormalities. Personally viewed. [place] reviewed sinus rhythm with no other abnormalities.  Negative stenosis bilateral carotid Dopplers on 12/21/14.  Echocardiogram 12/21/14 showed normal EF normal mitral valve no other abnormalities. Grade 1 diastolic dysfunction.  Recent Labs: 02/25/2017: ALT 46 02/28/2017: Hemoglobin 14.2; Magnesium 1.8; Platelets 286 03/02/2017: BUN 18; Creatinine, Ser 1.00; Potassium 4.0; Sodium 136   Recent Lipid Panel    Component Value Date/Time   CHOL 235 (H) 02/26/2017 1156   TRIG 218 (H) 02/26/2017 1156   HDL 42 02/26/2017 1156   CHOLHDL 5.6 02/26/2017 1156   VLDL 44 (H) 02/26/2017 1156   LDLCALC 149 (H) 02/26/2017 1156    Physical Exam:    VS:  BP (!) 162/82   Pulse 84   Ht 5\' 7"  (1.702 m)   Wt 175 lb 6.4 oz (79.6 kg)   SpO2 94%   BMI 27.47 kg/m     Wt Readings from Last 3 Encounters:  05/04/17 175 lb 6.4 oz (79.6 kg)  03/02/17 188 lb 12.8 oz (85.6 kg)  10/15/16 184 lb 6.4 oz (83.6 kg)    GEN: Well nourished, well developed, in no acute distress  HEENT: normal  Neck: no JVD, carotid bruits, or masses Cardiac: RRR; no murmurs, rubs, or gallops,no edema  Respiratory:  clear to auscultation bilaterally, normal work of breathing GI: soft, nontender, nondistended, + BS MS: no deformity or atrophy  Skin: warm and dry, no rash Neuro:  Alert and Oriented x 3, Strength and sensation are intact Psych: euthymic mood, full affect   ASSESSMENT:    1. Coronary artery disease involving native coronary artery of native heart without angina pectoris   2. Essential hypertension   3. Pure hypercholesterolemia    PLAN:    In order of problems listed above:  Coronary artery disease  - Previous stent placed in 2007. He has been on Plavix. [facility] [facility] [place]. Plavix alone. No ASA.   - I'm comfortable with him continuing with his Plavix. No  bruising, no bleeding.  -Overall doing well, no anginal symptoms.  Essential hypertension  - Currently mildly elevated on medications above. Dr. Conley RollsLe has been monitoring.   -Hypertensive encephalopathy noted in September 2018.  Resolved.  Hospital records reviewed.  Hyperlipidemia  - Continue with atorvastatin 80 mg-high intensity statin.  For decreased energy/fatigue  -I wonder if he is manifesting some evidence of depression especially with worry for his wife his renal function is failing.  CBC was reassuring, normal hemoglobin.  Consider checking TSH if not improving.  We will see him back in 12 months.   Medication Adjustments/Labs and Tests Ordered: Current medicines are reviewed at length with the patient today.  Concerns regarding medicines are outlined above. Labs and tests ordered and medication changes are outlined  in the patient instructions below:  Patient Instructions  Medication Instructions:  The current medical regimen is effective;  continue present plan and medications.  Follow-Up: Follow up in 1 year with Dr. Anne FuSkains.  You will receive a letter in the mail 2 months before you are due.  Please call us when you receive this letter to schedule your follow up appointment.  If you need a refill on your cardiac medications before your next appointment, please call your pharmacy.  Thank you for choosing [facility]!!        Signed, Donato SchultzMark Jaren Vanetten, MD  05/04/2017 11:35 AM    [facility]

## 2017-10-11 ENCOUNTER — Telehealth: Payer: Self-pay | Admitting: Cardiology

## 2017-10-11 NOTE — Telephone Encounter (Signed)
Called patient. Patient stated he just wanted an appointment with Dr. Anne Fu. Made patient an appointment in June. Patient stated he is always tired and this is nothing new. Will send message to Dr. Anne Fu' Nurse so she is aware.

## 2017-10-11 NOTE — Telephone Encounter (Signed)
New message  Pt verbalzied that he is calling for RN  Because he has been feeling very tired

## 2017-10-13 NOTE — Telephone Encounter (Signed)
noted 

## 2017-11-01 ENCOUNTER — Encounter: Payer: Self-pay | Admitting: Cardiology

## 2017-11-23 ENCOUNTER — Encounter: Payer: Self-pay | Admitting: Cardiology

## 2017-11-23 ENCOUNTER — Ambulatory Visit (INDEPENDENT_AMBULATORY_CARE_PROVIDER_SITE_OTHER): Payer: Medicare Other | Admitting: Cardiology

## 2017-11-23 VITALS — BP 150/78 | HR 71 | Ht 67.0 in | Wt 185.8 lb

## 2017-11-23 DIAGNOSIS — E7800 Pure hypercholesterolemia, unspecified: Secondary | ICD-10-CM | POA: Diagnosis not present

## 2017-11-23 DIAGNOSIS — R5383 Other fatigue: Secondary | ICD-10-CM | POA: Diagnosis not present

## 2017-11-23 DIAGNOSIS — I10 Essential (primary) hypertension: Secondary | ICD-10-CM | POA: Diagnosis not present

## 2017-11-23 DIAGNOSIS — Z79899 Other long term (current) drug therapy: Secondary | ICD-10-CM | POA: Diagnosis not present

## 2017-11-23 DIAGNOSIS — I2510 Atherosclerotic heart disease of native coronary artery without angina pectoris: Secondary | ICD-10-CM

## 2017-11-23 LAB — ALT: ALT: 35 IU/L (ref 0–44)

## 2017-11-23 LAB — LIPID PANEL
CHOLESTEROL TOTAL: 286 mg/dL — AB (ref 100–199)
Chol/HDL Ratio: 6.7 ratio — ABNORMAL HIGH (ref 0.0–5.0)
HDL: 43 mg/dL (ref >39)
LDL Calculated: 191 mg/dL — ABNORMAL HIGH (ref 0–99)
Triglycerides: 261 mg/dL — ABNORMAL HIGH (ref 0–149)
VLDL Cholesterol Cal: 52 mg/dL — ABNORMAL HIGH (ref 5–40)

## 2017-11-23 LAB — BASIC METABOLIC PANEL
BUN/Creatinine Ratio: 13 (ref 10–24)
BUN: 13 mg/dL (ref 8–27)
CO2: 25 mmol/L (ref 20–29)
Calcium: 10 mg/dL (ref 8.6–10.2)
Chloride: 98 mmol/L (ref 96–106)
Creatinine, Ser: 0.98 mg/dL (ref 0.76–1.27)
GFR calc Af Amer: 88 mL/min/{1.73_m2} (ref >59)
GFR calc non Af Amer: 76 mL/min/{1.73_m2} (ref >59)
GLUCOSE: 167 mg/dL — AB (ref 65–99)
POTASSIUM: 4 mmol/L (ref 3.5–5.2)
SODIUM: 142 mmol/L (ref 134–144)

## 2017-11-23 LAB — CBC
Hematocrit: 45.1 % (ref 37.5–51.0)
Hemoglobin: 15.8 g/dL (ref 13.0–17.7)
MCH: 30 pg (ref 26.6–33.0)
MCHC: 35 g/dL (ref 31.5–35.7)
MCV: 86 fL (ref 79–97)
PLATELETS: 286 10*3/uL (ref 150–450)
RBC: 5.27 x10E6/uL (ref 4.14–5.80)
RDW: 13.3 % (ref 12.3–15.4)
WBC: 7 10*3/uL (ref 3.4–10.8)

## 2017-11-23 LAB — TSH: TSH: 2.02 u[IU]/mL (ref 0.450–4.500)

## 2017-11-23 LAB — T4, FREE: FREE T4: 1.23 ng/dL (ref 0.82–1.77)

## 2017-11-23 MED ORDER — BENAZEPRIL HCL 20 MG PO TABS: 20.0000 mg | ORAL_TABLET | Freq: Every day | ORAL | 3 refills | Status: DC

## 2017-11-23 NOTE — Patient Instructions (Signed)
Medication Instructions:  Please increase your Benazepril to 20 mg a day. Continue all other medications as listed.  Labwork: Please have blood work today. (CBC, BMP, ALT, TSH, Free T4 and Lipid)  Follow-Up: Follow up in 6 months with Norma FredricksonLori Gerhardt, NP.  You will receive a letter in the mail 2 months before you are due.  Please call us when you receive this letter to schedule your follow up appointment.  Follow up in 1 year with Dr. Anne FuSkains.  You will receive a letter in the mail 2 months before you are due.  Please call us when you receive this letter to schedule your follow up appointment.  If you need a refill on your cardiac medications before your next appointment, please call your pharmacy.  Thank you for choosing [facility]!!

## 2017-11-23 NOTE — Progress Notes (Signed)
Cardiology Office Note:    Date:  11/23/2017   ID:  James PonsErwin Hall, DOB 11-11-1945, MRN [id number]  PCP:  Lenell AntuLe, Thao P, DO  Cardiologist:  Donato SchultzMark Baley Shands, MD    Referring MD: Lenell AntuLe, Thao P, DO     History of Present Illness:    James Ponsrwin Pauley is a 72 y.o. male with a history of CAD s/p PCI with HTN, HL here for follow up. He had his stent placed in 2007. Has essential hypertension medications reviewed, also hyperlipidemia. He is a former smoker quit 1974. Enjoys yard maintenance, backyard projects.  He has been compliant with his medications. He has been on Plavix, likely for early generation drug-eluting stent.  Cypher stent He had seen Dr. Patty SermonsBrackbill many many years ago.  05/04/17-he was admitted to the hospital on 02/28/17 with hypertensive emergency/hypertensive encephalopathy.  Medications were changed, stopped metoprolol, added amlodipine.  Overall his blood pressures at home have been in the 130s usually.  Today it was a little bit elevated.  No chest pain, no shortness of breath.  He has been feeling overall fatigue and lack of energy and increased worry especially about his wife had a 7% of her renal function.  He recently got a new mini Cooper.  MRI of brain reviewed, no atherosclerotic changes, echocardiogram showed normal ejection fraction.  11/23/2017- overall been doing quite well occasional cough shortness of breath with activity and loss of energy. In hospital with HTN in 9/18. Since then low energy. Has to sit sown after 10 min. No CP. Mild SOB with lifting.   Past Medical History:  Diagnosis Date  . Atherosclerotic heart disease of native coronary artery without angina pectoris   . Coronary arteriosclerosis due to lipid rich plaque   . Hypertension   . Mixed hyperlipidemia     Past Surgical History:  Procedure Laterality Date  . CAROTID STENT  2007    Current Medications: Current Meds  Medication Sig  . chlorthalidone (HYGROTON) 25 MG tablet Take 1 tablet (25 mg  total) by mouth daily.  . clopidogrel (PLAVIX) 75 MG tablet Take 75 mg by mouth daily.  . vitamin B-12 (CYANOCOBALAMIN) 100 MCG tablet Take 100 mcg by mouth daily. Gummie     Allergies:   Penicillins   Social History   Socioeconomic History  . Marital status: Married    Spouse name: Not on file  . Number of children: Not on file  . Years of education: Not on file  . Highest education level: Not on file  Occupational History  . Not on file  Social Needs  . Financial resource strain: Not on file  . Food insecurity:    Worry: Not on file    Inability: Not on file  . Transportation needs:    Medical: Not on file    Non-medical: Not on file  Tobacco Use  . Smoking status: Former Smoker    Types: Cigarettes    Last attempt to quit: 09/02/1972    Years since quitting: 45.2  . Smokeless tobacco: Never Used  Substance and Sexual Activity  . Alcohol use: Yes    Comment: RARELY  . Drug use: No  . Sexual activity: Not on file  Lifestyle  . Physical activity:    Days per week: Not on file    Minutes per session: Not on file  . Stress: Not on file  Relationships  . Social connections:    Talks on phone: Not on file    Gets together: Not on  file    Attends religious service: Not on file    Active member of club or organization: Not on file    Attends meetings of clubs or organizations: Not on file    Relationship status: Not on file  Other Topics Concern  . Not on file  Social History Narrative  . Not on file     Family History: The patient's family history includes Other in his father. Father killed in war, shot in concentration camp.   ROS:   Please see the history of present illness. All other ROS neg  EKGs/Labs/Other Studies Reviewed:    The following studies were reviewed today: Prior office records from Dr. Nedra Hai reviewed. Lab work reviewed, EKG reviewed.  Nuclear stress test 12/01/11 showed normal EF no ischemia  Cardiac catheterization on 05/20/2006-Cypher  stent placement 2.75 x 33 in mid left circumflex. Otherwise 50% D1, 50% distal LAD, 90% mid left circumflex, 70% lateral, 50% distal RCA, 80% PDA, EF 65%.  EKG:  EKG is ordered today.  The ekg ordered today demonstrates sinus rhythm 72 other abnormalities. Personally viewed. [place] reviewed sinus rhythm with no other abnormalities.  Negative stenosis bilateral carotid Dopplers on 12/21/14.  Echocardiogram 12/21/14 showed normal EF normal mitral valve no other abnormalities. Grade 1 diastolic dysfunction.  Recent Labs: 02/25/2017: ALT 46 02/28/2017: Hemoglobin 14.2; Magnesium 1.8; Platelets 286 03/02/2017: BUN 18; Creatinine, Ser 1.00; Potassium 4.0; Sodium 136   Recent Lipid Panel    Component Value Date/Time   CHOL 235 (H) 02/26/2017 1156   TRIG 218 (H) 02/26/2017 1156   HDL 42 02/26/2017 1156   CHOLHDL 5.6 02/26/2017 1156   VLDL 44 (H) 02/26/2017 1156   LDLCALC 149 (H) 02/26/2017 1156    Physical Exam:    VS:  BP (!) 150/78   Pulse 71   Ht 5\' 7"  (1.702 m)   Wt 185 lb 12.8 oz (84.3 kg)   SpO2 96%   BMI 29.10 kg/m     Wt Readings from Last 3 Encounters:  11/23/17 185 lb 12.8 oz (84.3 kg)  05/04/17 175 lb 6.4 oz (79.6 kg)  03/02/17 188 lb 12.8 oz (85.6 kg)    GEN: Well nourished, well developed, in no acute distress, obesity HEENT: normal  Neck: no JVD, carotid bruits, or masses Cardiac: RRR; no murmurs, rubs, or gallops,no edema  Respiratory:  clear to auscultation bilaterally, normal work of breathing GI: soft, nontender, nondistended, + BS MS: no deformity or atrophy  Skin: warm and dry, no rash Neuro:  Alert and Oriented x 3, Strength and sensation are intact Psych: euthymic mood, full affect    ASSESSMENT:    1. Coronary artery disease involving native coronary artery of native heart without angina pectoris   2. Essential hypertension   3. Fatigue, unspecified type   4. Long-term use of high-risk medication   5. Pure  hypercholesterolemia    PLAN:    In order of problems listed above:  Coronary artery disease  - Previous stent placed in 2007. He has been on Plavix. Dr. Melvyn Neth, [facility], [place]. Plavix alone. No ASA.   - I'm comfortable with him continuing with his Plavix. No bruising, no bleeding.  -Quite well without any anginal symptoms.  Just mild shortness of breath.  Checking a CBC to make sure this is not playing a role in fatigue.  Essential hypertension  -Still remains elevated.  At home he is still getting in the 130s to 150s.  I  will increase his Lotensin/benazepril to 20 mg.  Continue with amlodipine and hydrochlorothiazide.  I will check blood work to make sure that he is not experiencing hypokalemia which can cause lack of energy.  Still a major complaint for him.  Still continue to work on weight loss.  I think this would help.     -Hypertensive encephalopathy noted in September 2018.  Resolved.  Hospital records reviewed.  Hyperlipidemia  - Continue with atorvastatin 80 mg-high intensity statin.  -We will check lipid panel and ALT as well.  We will see him back in 6 months with Lawson Fiscal, 12 months with me   Medication Adjustments/Labs and Tests Ordered: Current medicines are reviewed at length with the patient today.  Concerns regarding medicines are outlined above. Labs and tests ordered and medication changes are outlined in the patient instructions below:  Patient Instructions  Medication Instructions:  Please increase your Benazepril to 20 mg a day. Continue all other medications as listed.  Labwork: Please have blood work today. (CBC, BMP, ALT, TSH, Free T4 and Lipid)  Follow-Up: Follow up in 6 months with Norma Fredrickson, NP.  You will receive a letter in the mail 2 months before you are due.  Please call us when you receive this letter to schedule your follow up appointment.  Follow up in 1 year with Dr. Anne Fu.  You will receive a letter in the mail 2 months before you  are due.  Please call us when you receive this letter to schedule your follow up appointment.  If you need a refill on your cardiac medications before your next appointment, please call your pharmacy.  Thank you for choosing [facility]!!        Signed, Donato Schultz, MD  11/23/2017 8:49 AM    [facility]

## 2017-12-06 ENCOUNTER — Other Ambulatory Visit: Payer: Self-pay | Admitting: Cardiology

## 2018-02-21 DIAGNOSIS — I10 Essential (primary) hypertension: Secondary | ICD-10-CM | POA: Diagnosis not present

## 2018-02-21 DIAGNOSIS — I2510 Atherosclerotic heart disease of native coronary artery without angina pectoris: Secondary | ICD-10-CM | POA: Diagnosis not present

## 2018-02-21 DIAGNOSIS — E782 Mixed hyperlipidemia: Secondary | ICD-10-CM | POA: Diagnosis not present

## 2018-02-21 DIAGNOSIS — Z23 Encounter for immunization: Secondary | ICD-10-CM | POA: Diagnosis not present

## 2018-02-21 DIAGNOSIS — Z1211 Encounter for screening for malignant neoplasm of colon: Secondary | ICD-10-CM | POA: Diagnosis not present

## 2018-02-21 DIAGNOSIS — E119 Type 2 diabetes mellitus without complications: Secondary | ICD-10-CM | POA: Diagnosis not present

## 2018-02-21 DIAGNOSIS — Z0000 Encounter for general adult medical examination without abnormal findings: Secondary | ICD-10-CM | POA: Diagnosis not present

## 2018-02-21 DIAGNOSIS — R5383 Other fatigue: Secondary | ICD-10-CM | POA: Diagnosis not present

## 2018-02-21 DIAGNOSIS — I2583 Coronary atherosclerosis due to lipid rich plaque: Secondary | ICD-10-CM | POA: Diagnosis not present

## 2018-02-21 DIAGNOSIS — Z7984 Long term (current) use of oral hypoglycemic drugs: Secondary | ICD-10-CM | POA: Diagnosis not present

## 2018-02-26 DIAGNOSIS — Z23 Encounter for immunization: Secondary | ICD-10-CM | POA: Diagnosis not present

## 2018-03-24 DIAGNOSIS — Z79899 Other long term (current) drug therapy: Secondary | ICD-10-CM | POA: Diagnosis not present

## 2018-03-24 DIAGNOSIS — R0602 Shortness of breath: Secondary | ICD-10-CM | POA: Diagnosis not present

## 2018-03-24 DIAGNOSIS — E119 Type 2 diabetes mellitus without complications: Secondary | ICD-10-CM | POA: Diagnosis not present

## 2018-03-24 DIAGNOSIS — I10 Essential (primary) hypertension: Secondary | ICD-10-CM | POA: Diagnosis not present

## 2018-03-24 DIAGNOSIS — R531 Weakness: Secondary | ICD-10-CM | POA: Diagnosis not present

## 2018-03-24 DIAGNOSIS — R200 Anesthesia of skin: Secondary | ICD-10-CM | POA: Diagnosis not present

## 2018-03-24 DIAGNOSIS — Z8673 Personal history of transient ischemic attack (TIA), and cerebral infarction without residual deficits: Secondary | ICD-10-CM | POA: Diagnosis not present

## 2018-03-24 DIAGNOSIS — E876 Hypokalemia: Secondary | ICD-10-CM | POA: Diagnosis not present

## 2018-03-24 DIAGNOSIS — Z955 Presence of coronary angioplasty implant and graft: Secondary | ICD-10-CM | POA: Diagnosis not present

## 2018-03-24 DIAGNOSIS — E785 Hyperlipidemia, unspecified: Secondary | ICD-10-CM | POA: Diagnosis not present

## 2018-05-30 ENCOUNTER — Ambulatory Visit (INDEPENDENT_AMBULATORY_CARE_PROVIDER_SITE_OTHER): Payer: Medicare Other | Admitting: Nurse Practitioner

## 2018-05-30 ENCOUNTER — Encounter: Payer: Self-pay | Admitting: Nurse Practitioner

## 2018-05-30 ENCOUNTER — Telehealth: Payer: Self-pay | Admitting: *Deleted

## 2018-05-30 ENCOUNTER — Encounter (INDEPENDENT_AMBULATORY_CARE_PROVIDER_SITE_OTHER): Payer: Self-pay

## 2018-05-30 VITALS — BP 164/70 | HR 81 | Ht 67.0 in | Wt 193.0 lb

## 2018-05-30 DIAGNOSIS — I2510 Atherosclerotic heart disease of native coronary artery without angina pectoris: Secondary | ICD-10-CM

## 2018-05-30 DIAGNOSIS — E7800 Pure hypercholesterolemia, unspecified: Secondary | ICD-10-CM

## 2018-05-30 DIAGNOSIS — R5383 Other fatigue: Secondary | ICD-10-CM

## 2018-05-30 DIAGNOSIS — I10 Essential (primary) hypertension: Secondary | ICD-10-CM | POA: Diagnosis not present

## 2018-05-30 LAB — CBC
Hematocrit: 47.1 % (ref 37.5–51.0)
Hemoglobin: 15.3 g/dL (ref 13.0–17.7)
MCH: 28.1 pg (ref 26.6–33.0)
MCHC: 32.5 g/dL (ref 31.5–35.7)
MCV: 86 fL (ref 79–97)
Platelets: 277 10*3/uL (ref 150–450)
RBC: 5.45 x10E6/uL (ref 4.14–5.80)
RDW: 12.6 % (ref 12.3–15.4)
WBC: 9.8 10*3/uL (ref 3.4–10.8)

## 2018-05-30 LAB — LIPID PANEL
Chol/HDL Ratio: 3.3 ratio (ref 0.0–5.0)
Cholesterol, Total: 152 mg/dL (ref 100–199)
HDL: 46 mg/dL (ref >39)
LDL Calculated: 70 mg/dL (ref 0–99)
Triglycerides: 178 mg/dL — ABNORMAL HIGH (ref 0–149)
VLDL Cholesterol Cal: 36 mg/dL (ref 5–40)

## 2018-05-30 LAB — BASIC METABOLIC PANEL
BUN/Creatinine Ratio: 19 (ref 10–24)
BUN: 17 mg/dL (ref 8–27)
CO2: 25 mmol/L (ref 20–29)
Calcium: 9.7 mg/dL (ref 8.6–10.2)
Chloride: 101 mmol/L (ref 96–106)
Creatinine, Ser: 0.91 mg/dL (ref 0.76–1.27)
GFR calc Af Amer: 96 mL/min/{1.73_m2} (ref >59)
GFR calc non Af Amer: 83 mL/min/{1.73_m2} (ref >59)
Glucose: 128 mg/dL — ABNORMAL HIGH (ref 65–99)
Potassium: 3.5 mmol/L (ref 3.5–5.2)
Sodium: 142 mmol/L (ref 134–144)

## 2018-05-30 LAB — HEPATIC FUNCTION PANEL
ALT: 27 IU/L (ref 0–44)
AST: 14 IU/L (ref 0–40)
Albumin: 4.6 g/dL (ref 3.5–4.8)
Alkaline Phosphatase: 73 IU/L (ref 39–117)
Bilirubin Total: 0.5 mg/dL (ref 0.0–1.2)
Bilirubin, Direct: 0.12 mg/dL (ref 0.00–0.40)
Total Protein: 6.7 g/dL (ref 6.0–8.5)

## 2018-05-30 MED ORDER — CHLORTHALIDONE 25 MG PO TABS: 25.0000 mg | ORAL_TABLET | Freq: Every day | ORAL | 3 refills | Status: DC

## 2018-05-30 MED ORDER — AMLODIPINE BESYLATE 10 MG PO TABS: 10.0000 mg | ORAL_TABLET | Freq: Every day | ORAL | 3 refills | Status: DC

## 2018-05-30 MED ORDER — CLOPIDOGREL BISULFATE 75 MG PO TABS: 75.0000 mg | ORAL_TABLET | Freq: Every day | ORAL | 3 refills | Status: DC

## 2018-05-30 MED ORDER — ATORVASTATIN CALCIUM 80 MG PO TABS: 80.0000 mg | ORAL_TABLET | Freq: Every day | ORAL | 3 refills | Status: DC

## 2018-05-30 MED ORDER — BENAZEPRIL HCL 20 MG PO TABS: 20.0000 mg | ORAL_TABLET | Freq: Two times a day (BID) | ORAL | 3 refills | Status: DC

## 2018-05-30 NOTE — Patient Instructions (Addendum)
We will be checking the following labs today - BMET, CBC, HPF and lipids  BMET in a couple of weeks    Medication Instructions:    Continue with your current medicines.   I have refilled medicines today  I am increasing the Lotensin to 20 mg to take TWICE a day - this is also at your pharmacy   If you need a refill on your cardiac medications before your next appointment, please call your pharmacy.     Testing/Procedures To Be Arranged:  N/A  Follow-Up:   See Dr. Anne FuSkains in 6 months; me in one year.   At [facility], you and your health needs are our priority.  As part of our continuing mission to provide you with exceptional heart care, we have created designated Provider Care Teams.  These Care Teams include your primary Cardiologist (physician) and Advanced Practice Providers (APPs -  Physician Assistants and Nurse Practitioners) who all work together to provide you with the care you need, when you need it.  Special Instructions:  . Keep a check on your BP - call us if it does not come down - goal is less than 135 systolic most of the time  Call the [facility] office at [phone] if you have any questions, problems or concerns.

## 2018-05-30 NOTE — Telephone Encounter (Signed)
[facility] to [facility]. Will update system,

## 2018-05-30 NOTE — Progress Notes (Signed)
CARDIOLOGY OFFICE NOTE  Date:  05/30/2018    James PonsErwin Bonito Date of Birth: 1946-05-29 Medical Record #[id number]  PCP:  Lenell AntuLe, Thao P, DO  Cardiologist:  Rick DuffGerhardt & Skains    Chief Complaint  Patient presents with  . Coronary Artery Disease    6 month check. Seen for Dr. Anne FuSkains     History of Present Illness: James Ponsrwin Mahaney is a 72 y.o. male who presents today for a 6 month check. Seen for Dr. Anne FuSkains. Former patient of Dr. Yevonne PaxBrackbill's.  He has a history of CAD s/p PCI in 2007 with HTN & HLD.  He is a former smoker quit 1974. He has been on Plavix, likely for early generation drug-eluting stent - Cypher stent  He was admitted to the hospital on 02/28/17 with hypertensive emergency/hypertensive encephalopathy.  Medications were changed, stopped metoprolol, added amlodipine.  MRI of brain reviewed, no atherosclerotic changes, echocardiogram showed normal ejection fraction.  Last seen in June - was doing ok. Some chronic issues with energy. BP meds adjusted for HTN.   Comes in today. Here alone. He has no idea about his medicines. His wife has died - this past July - she used to manage his medicines. Bp 135 to 150 at home. Changing PCP's - Dr. Conley RollsLe has left apparently. He feels good. No chest pain. Not short of breath unless really exert or carries something heavy. Not really exercising but joined a gym - will go occasionally - but it is a long car ride to get there. Weight is going up. Does not sound like he gets that much salt. Overall, he feels like he is doing ok.   Medicines confirmed by calling his pharmacy. No longer on his Metformin - now on Amaryl.   Past Medical History:  Diagnosis Date  . Atherosclerotic heart disease of native coronary artery without angina pectoris   . Coronary arteriosclerosis due to lipid rich plaque   . Hypertension   . Mixed hyperlipidemia     Past Surgical History:  Procedure Laterality Date  . CAROTID STENT  2007      Medications: Current Meds  Medication Sig  . amLODipine (NORVASC) 10 MG tablet Take 1 tablet (10 mg total) by mouth daily.  Marland Kitchen. atorvastatin (LIPITOR) 80 MG tablet Take 1 tablet (80 mg total) by mouth daily at 6 PM.  . benazepril (LOTENSIN) 20 MG tablet Take 1 tablet (20 mg total) by mouth 2 (two) times daily.  . chlorthalidone (HYGROTON) 25 MG tablet Take 1 tablet (25 mg total) by mouth daily.  . clopidogrel (PLAVIX) 75 MG tablet Take 1 tablet (75 mg total) by mouth daily.  Marland Kitchen. glimepiride (AMARYL) 2 MG tablet Take 2 mg by mouth daily with breakfast.  . vitamin B-12 (CYANOCOBALAMIN) 100 MCG tablet Take 100 mcg by mouth daily. Gummie  . [DISCONTINUED] amLODipine (NORVASC) 10 MG tablet Take 1 tablet (10 mg total) by mouth daily.  . [DISCONTINUED] atorvastatin (LIPITOR) 80 MG tablet Take 1 tablet (80 mg total) by mouth daily at 6 PM.  . [DISCONTINUED] benazepril (LOTENSIN) 20 MG tablet Take 1 tablet (20 mg total) by mouth daily.  . [DISCONTINUED] chlorthalidone (HYGROTON) 25 MG tablet TAKE 1 TABLET BY MOUTH ONCE DAILY  . [DISCONTINUED] clopidogrel (PLAVIX) 75 MG tablet Take 75 mg by mouth daily.     Allergies: Allergies  Allergen Reactions  . Penicillins Rash    Has patient had a PCN reaction causing immediate rash, facial/tongue/throat swelling, SOB or lightheadedness with hypotension: No  Has patient had a PCN reaction causing severe rash involving mucus membranes or skin necrosis: No Has patient had a PCN reaction that required hospitalization: No Has patient had a PCN reaction occurring within the last 10 years: No If all of the above answers are "NO", then may proceed with Cephalosporin use.    Social History: The patient  reports that he quit smoking about 45 years ago. His smoking use included cigarettes. He has never used smokeless tobacco. He reports current alcohol use. He reports that he does not use drugs.   Family History: The patient's family history includes Other in  his father.   Review of Systems: Please see the history of present illness.   Otherwise, the review of systems is positive for none.   All other systems are reviewed and negative.   Physical Exam: VS:  BP (!) 164/70 (BP Location: Left Arm, Patient Position: Sitting, Cuff Size: Normal)   Pulse 81   Ht 5\' 7"  (1.702 m)   Wt 193 lb (87.5 kg)   BMI 30.23 kg/m  .  BMI Body mass index is 30.23 kg/m.  Wt Readings from Last 3 Encounters:  05/30/18 193 lb (87.5 kg)  11/23/17 185 lb 12.8 oz (84.3 kg)  05/04/17 175 lb 6.4 oz (79.6 kg)   BP is 160/80 by me  General: Pleasant. Alert and in no acute distress. He has gained 8 pounds since last visit.   HEENT: Normal.  Neck: Supple, no JVD, carotid bruits, or masses noted.  Cardiac: Regular rate and rhythm. No murmurs, rubs, or gallops. No edema.  Respiratory:  Lungs are clear to auscultation bilaterally with normal work of breathing.  GI: Soft and nontender.  MS: No deformity or atrophy. Gait and ROM intact.  Skin: Warm and dry. Color is normal.  Neuro:  Strength and sensation are intact and no gross focal deficits noted.  Psych: Alert, appropriate and with normal affect.   LABORATORY DATA:  EKG:  EKG is ordered today. This demonstrates NSR with incomplete RBBB.  Lab Results  Component Value Date   WBC 7.0 11/23/2017   HGB 15.8 11/23/2017   HCT 45.1 11/23/2017   PLT 286 11/23/2017   GLUCOSE 167 (H) 11/23/2017   CHOL 286 (H) 11/23/2017   TRIG 261 (H) 11/23/2017   HDL 43 11/23/2017   LDLCALC 191 (H) 11/23/2017   ALT 35 11/23/2017   AST 37 02/25/2017   NA 142 11/23/2017   K 4.0 11/23/2017   CL 98 11/23/2017   CREATININE 0.98 11/23/2017   BUN 13 11/23/2017   CO2 25 11/23/2017   TSH 2.020 11/23/2017   INR 0.98 02/25/2017   HGBA1C 6.5 (H) 03/01/2017     BNP (last 3 results) No results for input(s): BNP in the last 8760 hours.  ProBNP (last 3 results) No results for input(s): PROBNP in the last 8760 hours.   Other  Studies Reviewed Today:  Echo Study Conclusions 02/2017  - Left ventricle: The cavity size was normal. There was mild   concentric hypertrophy. Systolic function was normal. The   estimated ejection fraction was in the range of 60% to 65%. Wall   motion was normal; there were no regional wall motion   abnormalities. Doppler parameters are consistent with abnormal   left ventricular relaxation (grade 1 diastolic dysfunction).   Doppler parameters are consistent with indeterminate ventricular   filling pressure. - Aortic valve: Transvalvular velocity was within the normal range.   There was no stenosis. There was  no regurgitation. - Mitral valve: Transvalvular velocity was within the normal range.   There was no evidence for stenosis. There was no regurgitation. - Right ventricle: The cavity size was normal. Wall thickness was   normal. Systolic function was normal. - Atrial septum: No defect or patent foramen ovale was identified. - Tricuspid valve: There was no regurgitation.   Assessment/Plan:  1. CAD with remote stent from 2007 - remains on chronic Plavix - no aspirin - doing well clinically. Needs CV risk factor modification.   2. HTN - not controlled - increasing his ACE to BID. Lab today and repeat BMET in a few weeks. He will continue to monitor. Goal is less than 135 systolic.   3. HLD - on statin - lab today.   4. Fatigue- not really noted today. I suspect he has some degree of depression.   5. Obesity - activity encouraged.   Current medicines are reviewed with the patient today.  The patient does not have concerns regarding medicines other than what has been noted above.  The following changes have been made:  See above.  Labs/ tests ordered today include:    Orders Placed This Encounter  Procedures  . Basic metabolic panel  . CBC  . Hepatic function panel  . Lipid panel  . Basic metabolic panel  . EKG 12-Lead     Disposition:   FU with Dr. Anne Fu in 6  months and me in 1 year.    Patient is agreeable to this plan and will call if any problems develop in the interim.   SignedNorma Fredrickson, NP  05/30/2018 9:43 AM  [facility] [address] [place], [place]  [address] Phone: [phone] Fax: [phone]

## 2018-06-21 ENCOUNTER — Other Ambulatory Visit: Payer: Medicare Other

## 2018-06-21 DIAGNOSIS — I10 Essential (primary) hypertension: Secondary | ICD-10-CM | POA: Diagnosis not present

## 2018-06-23 ENCOUNTER — Other Ambulatory Visit: Payer: Self-pay | Admitting: *Deleted

## 2018-06-23 MED ORDER — METOPROLOL TARTRATE 25 MG PO TABS: 25.0000 mg | ORAL_TABLET | Freq: Two times a day (BID) | ORAL | 2 refills | Status: DC

## 2018-08-17 DIAGNOSIS — Z79899 Other long term (current) drug therapy: Secondary | ICD-10-CM | POA: Diagnosis not present

## 2018-08-17 DIAGNOSIS — R7303 Prediabetes: Secondary | ICD-10-CM | POA: Diagnosis not present

## 2018-08-17 DIAGNOSIS — E039 Hypothyroidism, unspecified: Secondary | ICD-10-CM | POA: Diagnosis not present

## 2018-08-17 DIAGNOSIS — E669 Obesity, unspecified: Secondary | ICD-10-CM | POA: Diagnosis not present

## 2018-08-17 DIAGNOSIS — I2510 Atherosclerotic heart disease of native coronary artery without angina pectoris: Secondary | ICD-10-CM | POA: Diagnosis not present

## 2018-08-17 DIAGNOSIS — I10 Essential (primary) hypertension: Secondary | ICD-10-CM | POA: Diagnosis not present

## 2018-08-17 DIAGNOSIS — R0602 Shortness of breath: Secondary | ICD-10-CM | POA: Diagnosis not present

## 2018-08-17 DIAGNOSIS — Z6831 Body mass index (BMI) 31.0-31.9, adult: Secondary | ICD-10-CM | POA: Diagnosis not present

## 2018-08-17 DIAGNOSIS — E119 Type 2 diabetes mellitus without complications: Secondary | ICD-10-CM | POA: Diagnosis not present

## 2018-08-17 DIAGNOSIS — R0600 Dyspnea, unspecified: Secondary | ICD-10-CM | POA: Diagnosis not present

## 2018-08-17 DIAGNOSIS — E538 Deficiency of other specified B group vitamins: Secondary | ICD-10-CM | POA: Diagnosis not present

## 2018-08-17 DIAGNOSIS — E785 Hyperlipidemia, unspecified: Secondary | ICD-10-CM | POA: Diagnosis not present

## 2018-08-17 DIAGNOSIS — E559 Vitamin D deficiency, unspecified: Secondary | ICD-10-CM | POA: Diagnosis not present

## 2018-11-08 ENCOUNTER — Telehealth (INDEPENDENT_AMBULATORY_CARE_PROVIDER_SITE_OTHER): Payer: Medicare Other | Admitting: Cardiology

## 2018-11-08 ENCOUNTER — Encounter: Payer: Self-pay | Admitting: Cardiology

## 2018-11-08 VITALS — BP 155/76 | HR 62 | Ht 67.0 in | Wt 185.0 lb

## 2018-11-08 DIAGNOSIS — I2510 Atherosclerotic heart disease of native coronary artery without angina pectoris: Secondary | ICD-10-CM

## 2018-11-08 MED ORDER — METOPROLOL TARTRATE 50 MG PO TABS: 50.0000 mg | ORAL_TABLET | Freq: Two times a day (BID) | ORAL | 3 refills | Status: DC

## 2018-11-08 NOTE — Patient Instructions (Signed)
Medication Instructions:  Please increase your Metoprolol to 50 mg twice a day.  A new prescription has been sent into your pharmacy for this.  Continue all other medications as listed.  If you need a refill on your cardiac medications before your next appointment, please call your pharmacy.   Follow-Up: At [facility], you and your health needs are our priority.  As part of our continuing mission to provide you with exceptional heart care, we have created designated Provider Care Teams.  These Care Teams include your primary Cardiologist (physician) and Advanced Practice Providers (APPs -  Physician Assistants and Nurse Practitioners) who all work together to provide you with the care you need, when you need it. You will need a follow up appointment in 6 months with Norma Fredrickson, NP and Dr Anne Fu in 1 year.  Please call our office 2 months in advance to schedule this appointment.  You may see Donato Schultz, MD or one of the following Advanced Practice Providers on your designated Care Team:   Norma Fredrickson, NP Nada Boozer, NP . Georgie Chard, NP  Thank you for choosing [facility]!!

## 2018-11-08 NOTE — Progress Notes (Signed)
Virtual Visit via Telephone Note   This visit type was conducted due to national recommendations for restrictions regarding the COVID-19 Pandemic (e.g. social distancing) in an effort to limit this patient's exposure and mitigate transmission in our community.  Due to his co-morbid illnesses, this patient is at least at moderate risk for complications without adequate follow up.  This format is felt to be most appropriate for this patient at this time.  The patient did not have access to video technology/had technical difficulties with video requiring transitioning to audio format only (telephone).  All issues noted in this document were discussed and addressed.  No physical exam could be performed with this format.  Please refer to the patient's chart for his  consent to telehealth [facility].   Date:  11/08/2018   ID:  James PonsErwin Balow, DOB 1946-02-19, MRN [id number]  Patient Location: Home Provider Location: Home  PCP:  Lenell AntuLe, Thao P, DO  Cardiologist:  Donato SchultzMark Autumnrose Yore, MD  Electrophysiologist:  None   Evaluation Performed:  Follow-Up Visit  Chief Complaint: Hypertension follow-up  History of Present Illness:    James Ponsrwin Donna is a 73 y.o. male with CAD hypertension hyperlipidemia here for follow-up.  Former patient of Dr. Yevonne PaxBrackbill's.  Prior PCI in 2007 with early generation DES, Cypher on chronic Plavix to help prevent in-stent thrombosis.  In 2018 was admitted with hypertensive urgency/encephalopathy.  Amlodipine was added.  Metoprolol was stopped at the time.  Normal ejection fraction at that time.  At last visit with Lawson FiscalLori in December 2019 his wife had died previous to that in July used to manage his medications and he had had difficulty figure out what he was taking.  Mild SOB, no CP. +Fatigue. Joined a gym, pulled a muscle.  He quit going.  Encouraged activity and movement.  BP at home 140/62, 130/67, 147/79  The patient does not have symptoms concerning for COVID-19  infection (fever, chills, cough, or new shortness of breath).    Past Medical History:  Diagnosis Date  . Atherosclerotic heart disease of native coronary artery without angina pectoris   . Coronary arteriosclerosis due to lipid rich plaque   . Hypertension   . Mixed hyperlipidemia    Past Surgical History:  Procedure Laterality Date  . CAROTID STENT  2007     Current Meds  Medication Sig  . amLODipine (NORVASC) 10 MG tablet Take 1 tablet (10 mg total) by mouth daily.  Marland Kitchen. atorvastatin (LIPITOR) 80 MG tablet Take 1 tablet (80 mg total) by mouth daily at 6 PM.  . benazepril (LOTENSIN) 20 MG tablet Take 1 tablet (20 mg total) by mouth 2 (two) times daily.  . chlorthalidone (HYGROTON) 25 MG tablet Take 1 tablet (25 mg total) by mouth daily.  . clopidogrel (PLAVIX) 75 MG tablet Take 1 tablet (75 mg total) by mouth daily.  . metFORMIN (GLUCOPHAGE) 500 MG tablet Take 1 tablet (500 mg total) by mouth daily with breakfast.  . metoprolol tartrate (LOPRESSOR) 50 MG tablet Take 1 tablet (50 mg total) by mouth 2 (two) times daily.  . vitamin B-12 (CYANOCOBALAMIN) 100 MCG tablet Take 100 mcg by mouth daily. Gummie  . [DISCONTINUED] metoprolol tartrate (LOPRESSOR) 25 MG tablet Take 1 tablet (25 mg total) by mouth 2 (two) times daily.     Allergies:   Penicillins   Social History   Tobacco Use  . Smoking status: Former Smoker    Types: Cigarettes    Last attempt to quit: 09/02/1972  Years since quitting: 46.2  . Smokeless tobacco: Never Used  Substance Use Topics  . Alcohol use: Yes    Comment: RARELY  . Drug use: No     Family Hx: The patient's family history includes Other in his father.  ROS:   Please see the history of present illness.    Denies any fevers chills nausea vomiting syncope bleeding All other systems reviewed and are negative.   Prior CV studies:   The following studies were reviewed today:  Echocardiogram 2018- mild LVH 65% EF  Labs/Other Tests and Data  Reviewed:    EKG:  An ECG dated 05/30/18 was personally reviewed today and demonstrated:  Sinus rhythm incomplete right bundle branch block  Recent Labs: 11/23/2017: TSH 2.020 05/30/2018: ALT 27; BUN 17; Creatinine, Ser 0.91; Hemoglobin 15.3; Platelets 277; Potassium 3.5; Sodium 142   Recent Lipid Panel Lab Results  Component Value Date/Time   CHOL 152 05/30/2018 09:52 AM   TRIG 178 (H) 05/30/2018 09:52 AM   HDL 46 05/30/2018 09:52 AM   CHOLHDL 3.3 05/30/2018 09:52 AM   CHOLHDL 5.6 02/26/2017 11:56 AM   LDLCALC 70 05/30/2018 09:52 AM    Wt Readings from Last 3 Encounters:  11/08/18 185 lb (83.9 kg)  05/30/18 193 lb (87.5 kg)  11/23/17 185 lb 12.8 oz (84.3 kg)     Objective:    Vital Signs:  BP (!) 155/76   Pulse 62   Ht 5\' 7"  (1.702 m)   Wt 185 lb (83.9 kg)   BMI 28.98 kg/m    VITAL SIGNS:  reviewed  ASSESSMENT & PLAN:    Coronary artery disease status post PCI 2007 - Chronic-no aspirin doing well secondary risk factor modification.  Cypher stent.  Essential hypertension -Difficult to control.  At last visit increased ACE inhibitor to twice a day.  Lab work was checked.  I have increased his metoprolol to 50 mg twice a day.  Hyperlipidemia -On statin therapy.  Labs reviewed.  LDL was 70, triglycerides 824.  Marked improvement from LDL 191 in June 2019.  Last creatinine was 0.91.  COVID-19 Education: The signs and symptoms of COVID-19 were discussed with the patient and how to seek care for testing (follow up with PCP or arrange E-visit).  The importance of social distancing was discussed today.  Time:   Today, I have spent 11 minutes with the patient with telehealth technology discussing the above problems.     Medication Adjustments/Labs and Tests Ordered: Current medicines are reviewed at length with the patient today.  Concerns regarding medicines are outlined above.   Tests Ordered: No orders of the defined types were placed in this encounter.    Medication Changes: Meds ordered this encounter  Medications  . metoprolol tartrate (LOPRESSOR) 50 MG tablet    Sig: Take 1 tablet (50 mg total) by mouth 2 (two) times daily.    Dispense:  180 tablet    Refill:  3    Disposition:  Follow up in 6 month(s)  Signed, Donato Schultz, MD  11/08/2018 5:04 PM    [facility]

## 2018-11-11 IMAGING — CT CT HEAD W/O CM
3 series · 15 of 47 positions shown, 18 images · non-contrast
Comparison: None.

CLINICAL DATA: Dizziness and lightheadedness beginning this
morning.

EXAM:
CT HEAD WITHOUT CONTRAST
TECHNIQUE: Contiguous axial images were obtained from the base of the skull
through the vertex without intravenous contrast.

[Series 3: head 5.0 h30s · axial · 0.38mm/px · z∈[-208,-58]mm · 9 of 36 slices shown, 12 images]
[im 3/36  brain]
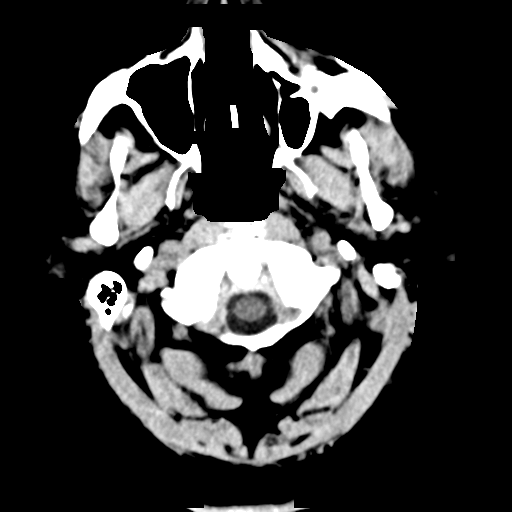
[im 3/36  bone]
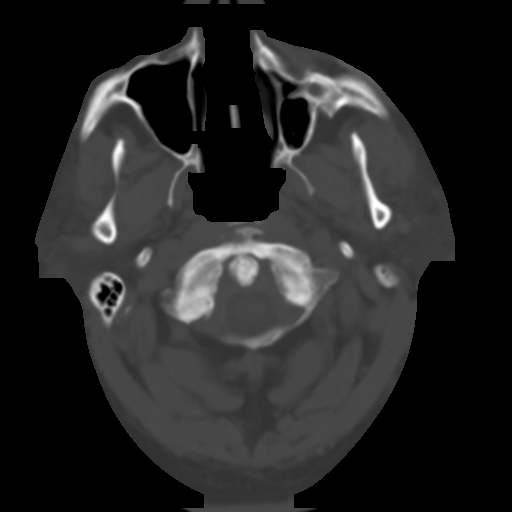
[im 7/36  brain]
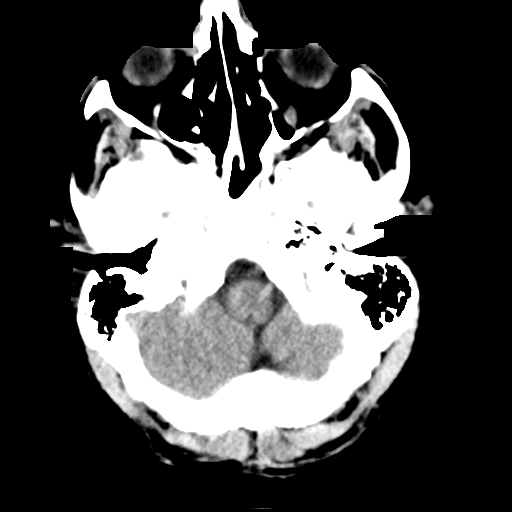
[im 10/36  brain]
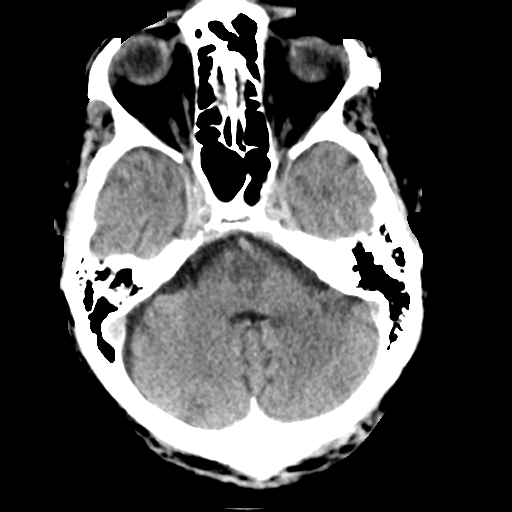
[im 14/36  brain]
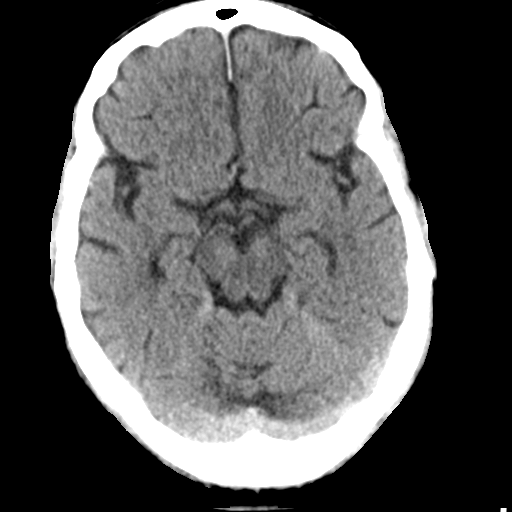
[im 19/36  brain]
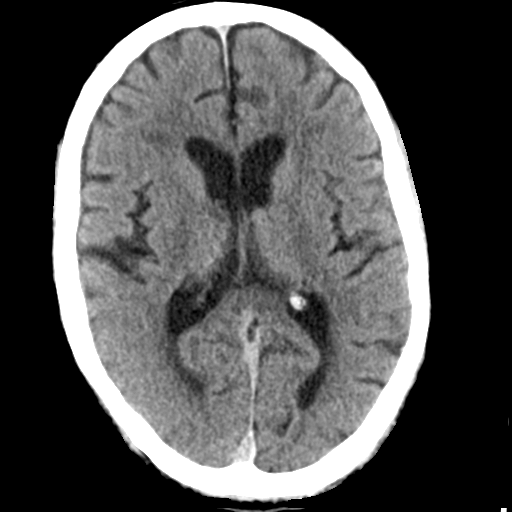
[im 19/36  bone]
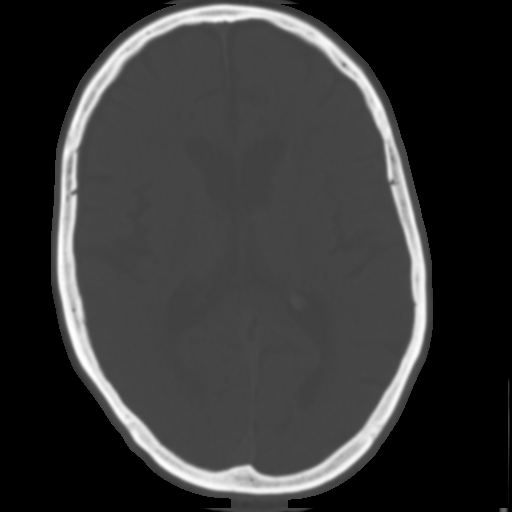
[im 22/36  brain]
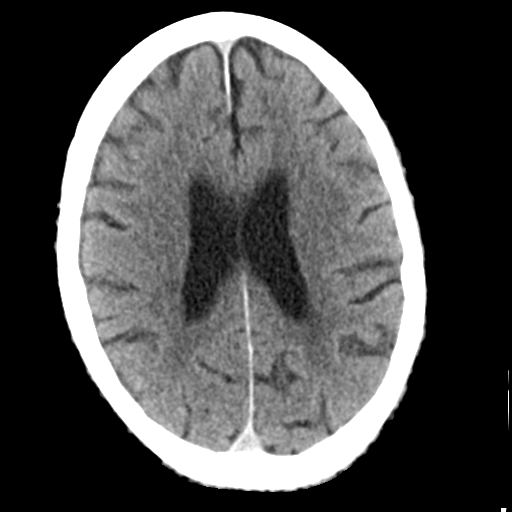
[im 26/36  brain]
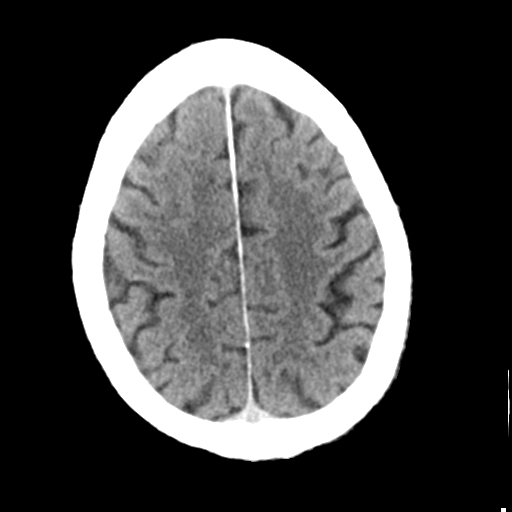
[im 29/36  brain]
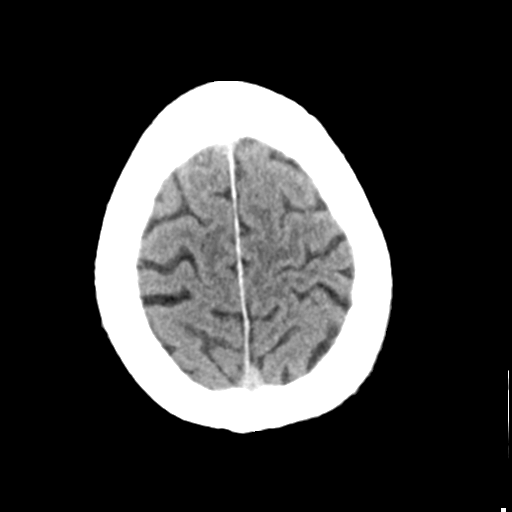
[im 33/36  brain]
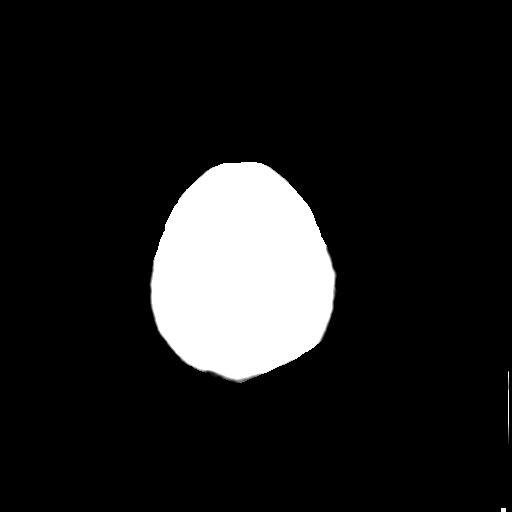
[im 33/36  bone]
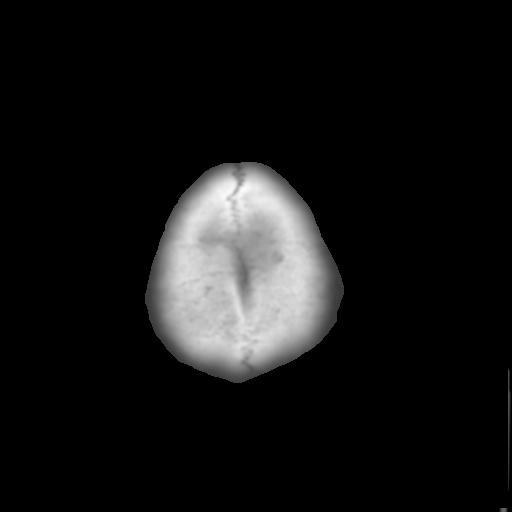

[Series 5: head 3.0 mpr cor · coronal · 0.34mm/px · 3 of 75 slices shown]
[im 25/75  brain]
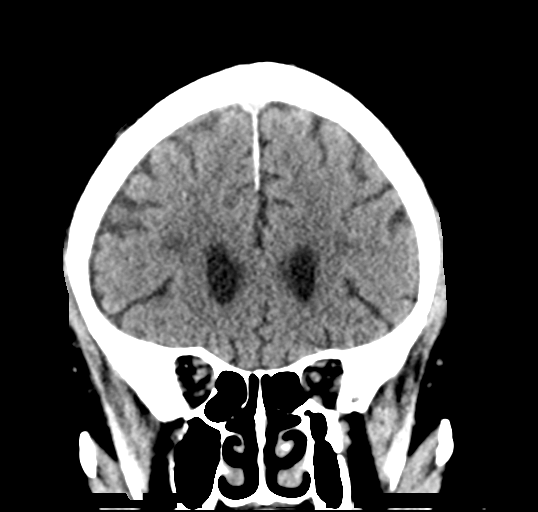
[im 33/75  brain]
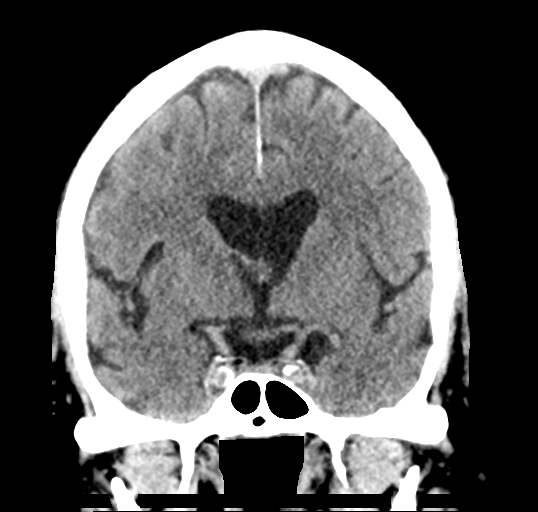
[im 42/75  brain]
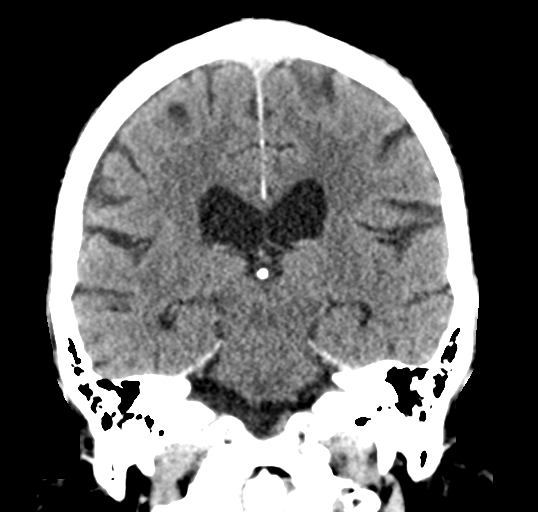

[Series 6: head 3.0 mpr sag · sagittal · 0.34mm/px · 3 of 67 slices shown]
[im 23/67  brain]
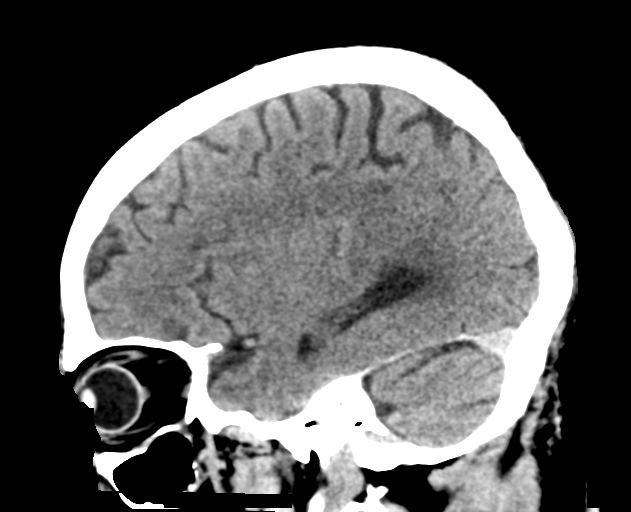
[im 34/67  brain]
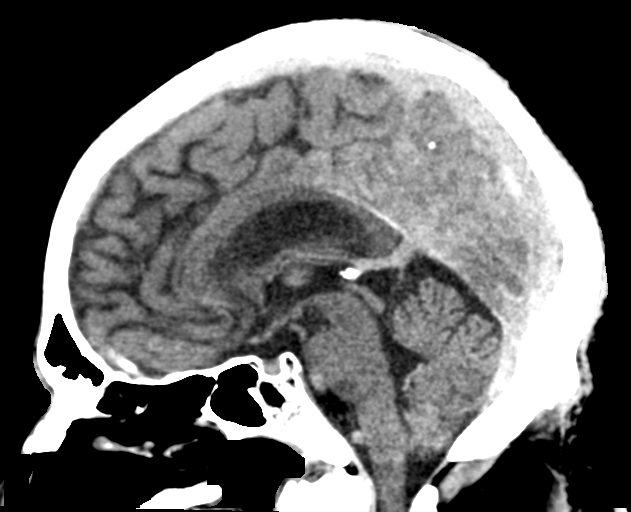
[im 45/67  brain]
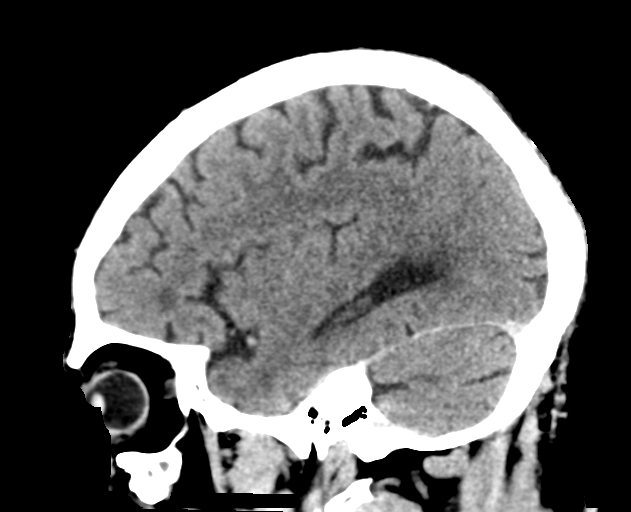

[15 of 47 positions shown; findings below may reference images not displayed]

FINDINGS: BRAIN: No intraparenchymal hemorrhage, mass effect nor midline
shift. The ventricles and sulci are normal for age. Patchy
supratentorial white matter hypodensities less than expected for
patient's age, though non-specific are most compatible with chronic
small vessel ischemic disease. No acute large vascular territory
infarcts. No abnormal extra-axial fluid collections. Basal cisterns
are patent.

VASCULAR: Trace calcific atherosclerosis of the carotid siphons.

SKULL: No skull fracture. Old bilateral nasal bone fractures. No
significant scalp soft tissue swelling.

SINUSES/ORBITS: Chronic LEFT maxillary sinusitis. Old LEFT orbital
floor fracture and anterior maxillary wall fracture with cerclage
wires. Trace paranasal sinus mucosal thickening. The included ocular
globes and orbital contents are non-suspicious.

OTHER: None.
IMPRESSION: 1. No acute intracranial process. Negative noncontrast CT HEAD for
age.
2. Old facial/orbital fractures with chronic LEFT maxillary
sinusitis.

## 2018-11-11 IMAGING — MR MR HEAD W/O CM
11 of 12 series · 40 of 48 positions shown · non-contrast
Comparison: 02/25/2017 CT head.

CLINICAL DATA: 71 y/o M; dizziness and slight nausea. History of
TIA.

EXAM:
MRI HEAD WITHOUT CONTRAST
TECHNIQUE: Multiplanar, multiecho pulse sequences of the brain and surrounding
structures were obtained without intravenous contrast.

[Series 3: DWI · axial · 3.0mm · 1.09mm/px · z∈[-24,+121]mm · 7 of 100 slices shown (1 of 6)]
[im 1/100]
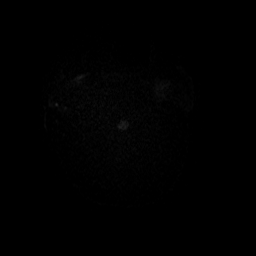
[im 17/100]
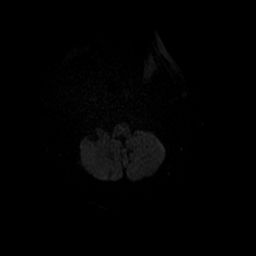
[im 34/100]
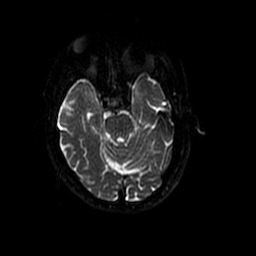
[im 50/100]
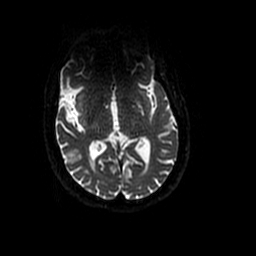
[im 67/100]
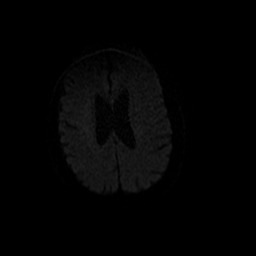
[im 83/100]
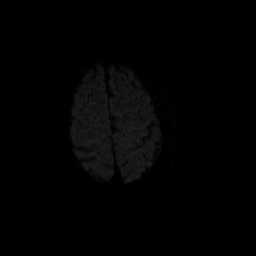
[im 100/100]
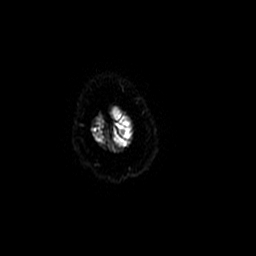

[Series 4: DWI · coronal · 5.0mm · 1.09mm/px · 5 of 70 slices shown (2 of 6)]
[im 1/70]
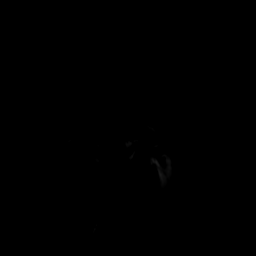
[im 18/70]
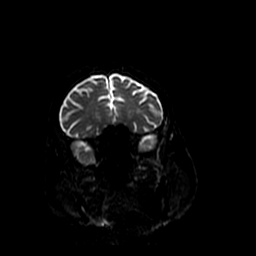
[im 35/70]
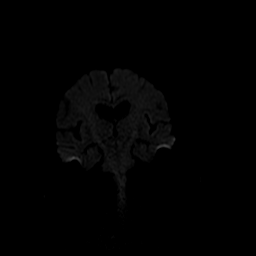
[im 52/70]
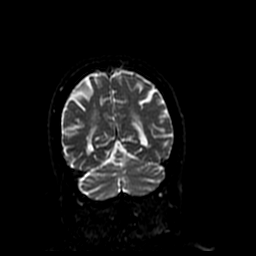
[im 70/70]
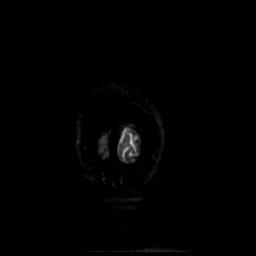

[Series 5: DWI · axial · 3.0mm · 1.09mm/px · z∈[-32,+114]mm · 8 of 100 slices shown (3 of 6)]
[im 1/100]
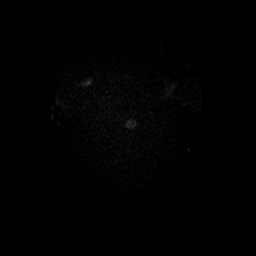
[im 15/100]
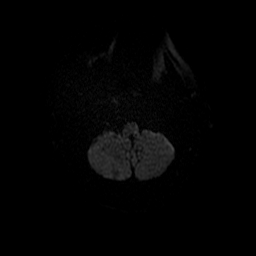
[im 29/100]
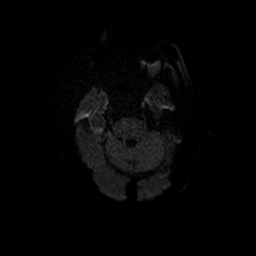
[im 43/100]
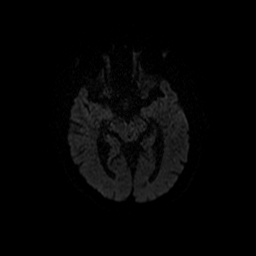
[im 57/100]
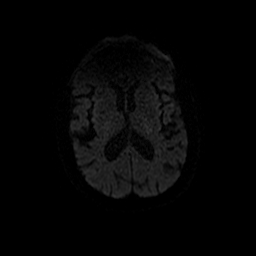
[im 71/100]
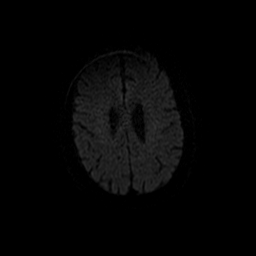
[im 85/100]
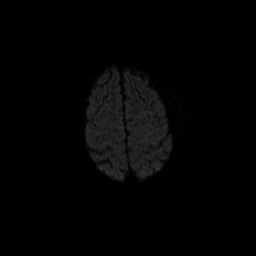
[im 100/100]
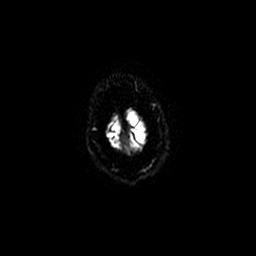

[Series 6: T2 · axial · 5.0mm · 0.47mm/px · z∈[-34,+127]mm · 2 of 28 slices shown (1 of 2)]
[im 1/28]
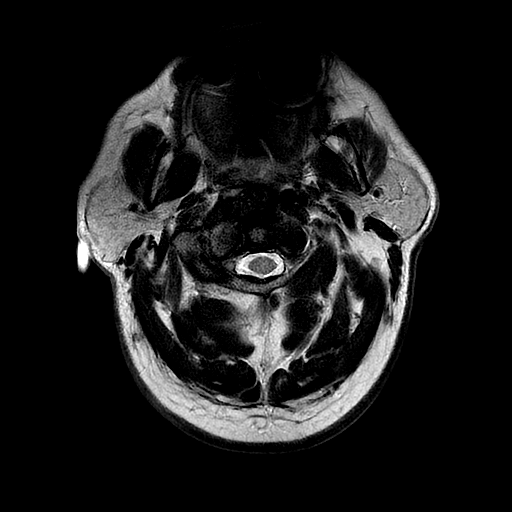
[im 28/28]
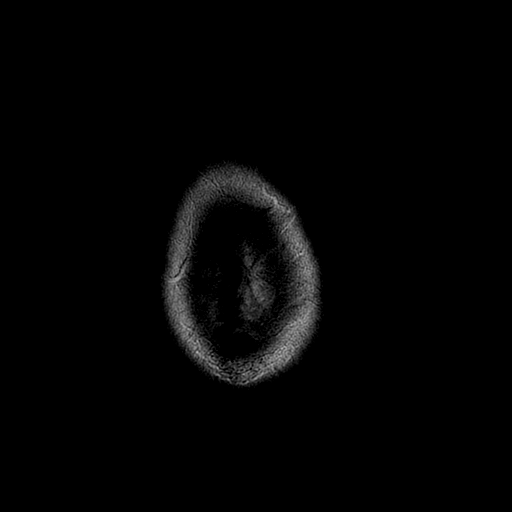

[Series 7: FLAIR · axial · 5.0mm · 0.43mm/px · z∈[-33,+128]mm · 2 of 28 slices shown]
[im 1/28]
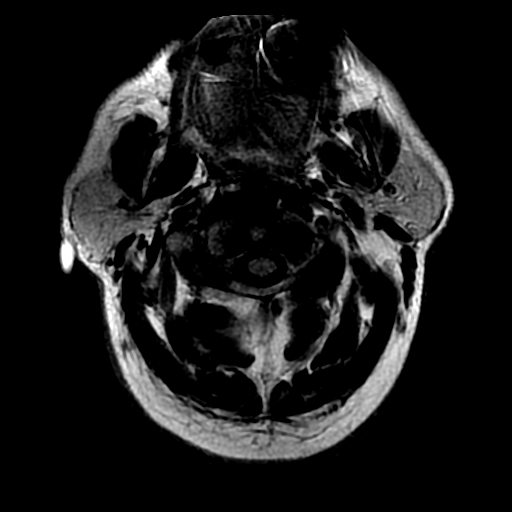
[im 28/28]
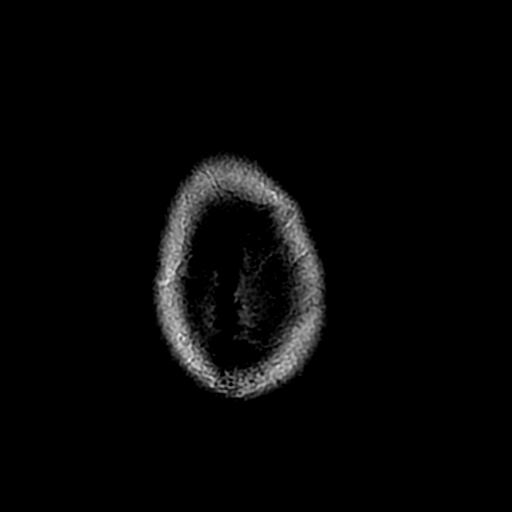

[Series 8: T1 · sagittal · 5.0mm · 0.47mm/px · 2 of 23 slices shown]
[im 1/23]
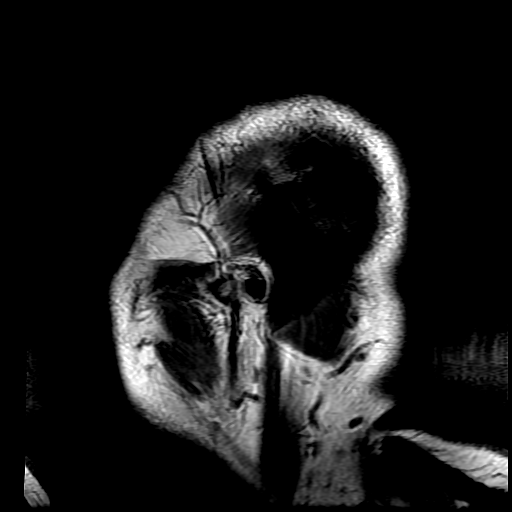
[im 23/23]
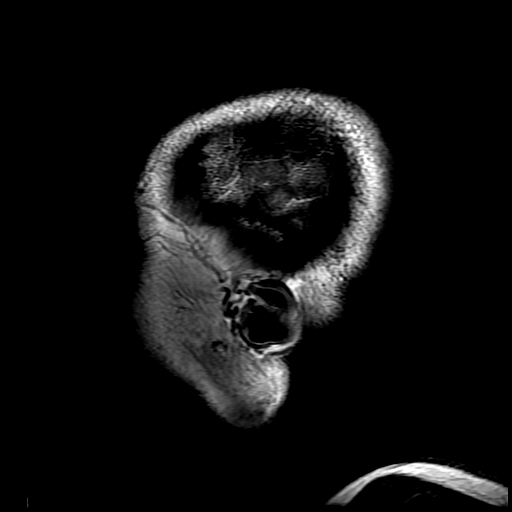

[Series 9: T2 · coronal · 5.0mm · 0.43mm/px · 2 of 31 slices shown (2 of 2)]
[im 1/31]
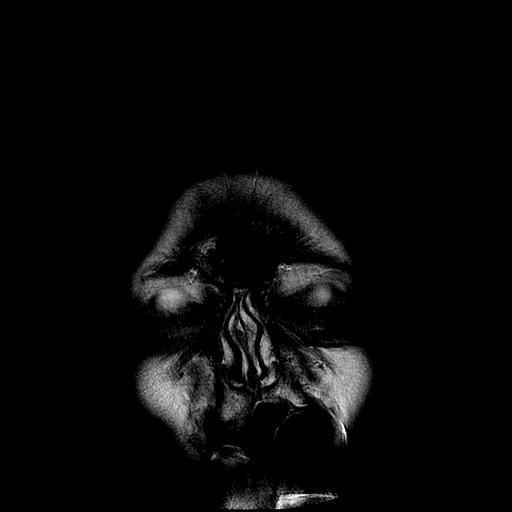
[im 31/31]
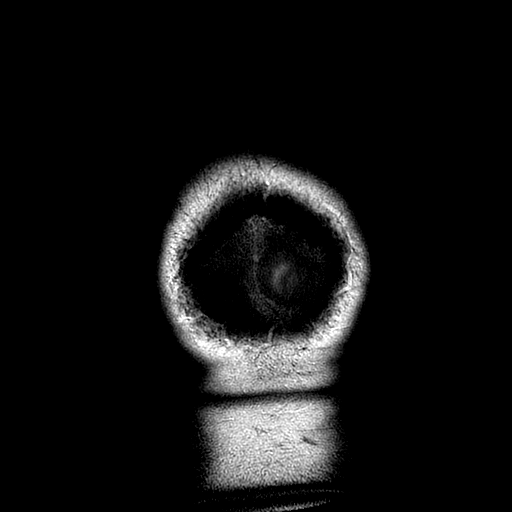

[Series 10: ax mpgr · axial · 5.0mm · 0.43mm/px · 1 of 28 slices shown]
[im 1/28]
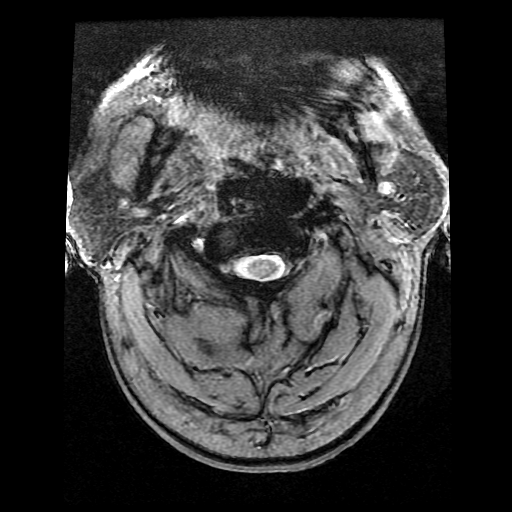

[Series 300: DWI · axial · 3.0mm · 1.09mm/px · z∈[-24,+121]mm · 4 of 50 slices shown (4 of 6)]
[im 1/50]
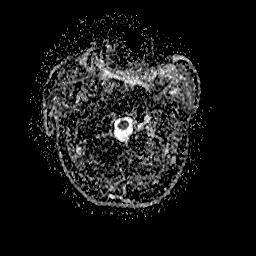
[im 17/50]
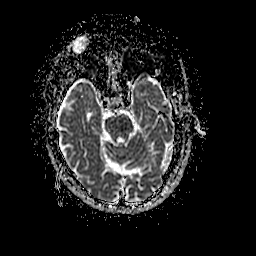
[im 33/50]
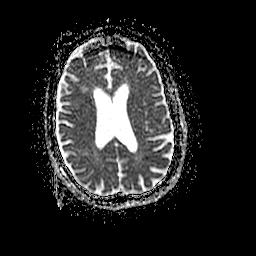
[im 50/50]
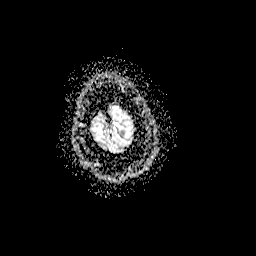

[Series 400: DWI · coronal · 5.0mm · 1.09mm/px · 3 of 35 slices shown (5 of 6)]
[im 1/35]
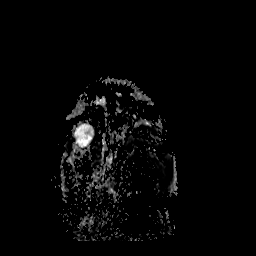
[im 18/35]
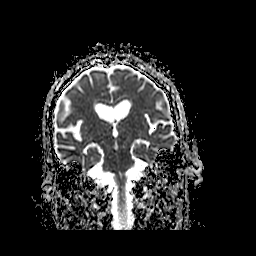
[im 35/35]
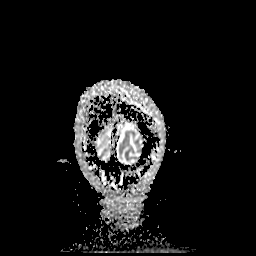

[Series 500: DWI · axial · 3.0mm · 1.09mm/px · z∈[-32,+114]mm · 4 of 50 slices shown (6 of 6)]
[im 1/50]
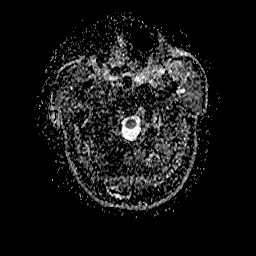
[im 17/50]
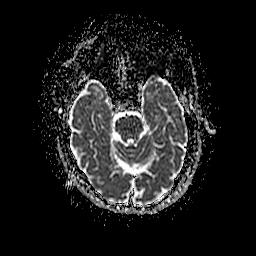
[im 33/50]
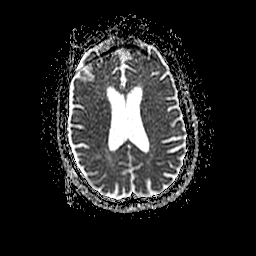
[im 50/50]
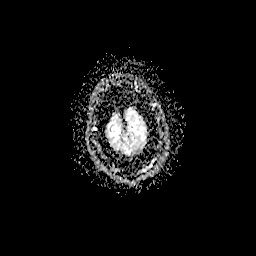

[40 of 48 positions shown; findings below may reference images not displayed]

FINDINGS: Brain: No acute infarction, hemorrhage, hydrocephalus, extra-axial
collection or mass lesion. Few nonspecific foci of T2 FLAIR
hyperintense signal abnormality in subcortical and periventricular
white matter are compatible with mild chronic microvascular ischemic
changes for age. Mild brain parenchymal volume loss. Punctate focus
of susceptibility hypo intensities within right posterior parietal
lobe likely represents hemosiderin deposition of chronic
microhemorrhage.

Vascular: Normal flow voids.

Skull and upper cervical spine: Normal marrow signal.

Sinuses/Orbits: Chronic left orbital and zygomatic complex fracture
the surgical hardware creating susceptibility artifact partially
obscuring the region on several sequences. Left maxillary sinus
mucosal thickening. Otherwise negative.

Other: None.
IMPRESSION: 1. No acute intracranial abnormality.
2. Mild chronic microvascular ischemic changes and mild parenchymal
volume loss of the brain.
3. Chronic left orbital and zygomatic complex fracture as well as
left maxillary sinus mucosal thickening as seen on prior CT.

By: Yo Nedd M.D.

## 2018-11-17 DIAGNOSIS — I2510 Atherosclerotic heart disease of native coronary artery without angina pectoris: Secondary | ICD-10-CM | POA: Diagnosis not present

## 2018-11-17 DIAGNOSIS — E559 Vitamin D deficiency, unspecified: Secondary | ICD-10-CM | POA: Diagnosis not present

## 2018-11-17 DIAGNOSIS — I10 Essential (primary) hypertension: Secondary | ICD-10-CM | POA: Diagnosis not present

## 2018-11-17 DIAGNOSIS — E669 Obesity, unspecified: Secondary | ICD-10-CM | POA: Diagnosis not present

## 2018-11-17 DIAGNOSIS — E785 Hyperlipidemia, unspecified: Secondary | ICD-10-CM | POA: Diagnosis not present

## 2018-11-23 ENCOUNTER — Ambulatory Visit: Payer: Medicare Other | Admitting: Cardiology

## 2019-02-14 ENCOUNTER — Telehealth: Payer: Self-pay | Admitting: *Deleted

## 2019-02-14 NOTE — Telephone Encounter (Signed)
Pt contacted thru recall list to schedule yearly f/u with Dr. Marlou Porch. Pt seeing 02/15/2019 at 9:00.  Pt aware to arrive 15 min early for registration and to wear a mask. Pt has answered NO to all covid19 questions below:      COVID-19 Pre-Screening Questions:  . In the past 7 to 10 days have you had a cough,  shortness of breath, headache, congestion, fever (100 or greater) body aches, chills, sore throat, or sudden loss of taste or sense of smell? . Have you been around anyone with known Covid 19. . Have you been around anyone who is awaiting Covid 19 test results in the past 7 to 10 days? . Have you been around anyone who has been exposed to Covid 19, or has mentioned symptoms of Covid 19 within the past 7 to 10 days?  If you have any concerns/questions about symptoms patients report during screening (either on the phone or at threshold). Contact the provider seeing the patient or DOD for further guidance.  If neither are available contact a member of the leadership team.

## 2019-02-15 ENCOUNTER — Encounter: Payer: Self-pay | Admitting: Cardiology

## 2019-02-15 ENCOUNTER — Other Ambulatory Visit: Payer: Self-pay

## 2019-02-15 ENCOUNTER — Ambulatory Visit (INDEPENDENT_AMBULATORY_CARE_PROVIDER_SITE_OTHER): Payer: Medicare Other | Admitting: Cardiology

## 2019-02-15 VITALS — BP 140/60 | HR 62 | Ht 67.0 in | Wt 195.0 lb

## 2019-02-15 DIAGNOSIS — E7800 Pure hypercholesterolemia, unspecified: Secondary | ICD-10-CM | POA: Diagnosis not present

## 2019-02-15 DIAGNOSIS — I2510 Atherosclerotic heart disease of native coronary artery without angina pectoris: Secondary | ICD-10-CM

## 2019-02-15 DIAGNOSIS — I10 Essential (primary) hypertension: Secondary | ICD-10-CM | POA: Diagnosis not present

## 2019-02-15 DIAGNOSIS — R5383 Other fatigue: Secondary | ICD-10-CM

## 2019-02-15 NOTE — Patient Instructions (Signed)
Medication Instructions:   WE ARE GOING TO TAPER DOWN YOUR METOPROLOL AS SO:  TAKE 1/2 TAB (25 MG TOTAL) BY MOUTH TWICE DAILY FOR 3 DAYS, THEN TAKE 1/2 TAB (25 MG TOTAL) BY MOUTH DAILY FOR ONE WEEK, THEN DISCONTINUE THIS MEDICATION ALL TOGETHER THEREAFTER.   If you need a refill on your cardiac medications before your next appointment, please call your pharmacy.     Follow-Up:  2  MONTHS WITH JILL MCDANIEL NP AS A REGULAR OFFICE VISIT     Any Other Special Instructions Will Be Listed Below (If Applicable).  DR. Marlou Porch WOULD LIKE FOR YOU TO MONITOR YOUR BP AND HR DAILY AND LOG THIS--PLEASE REPORT ANY ABNORMAL FINDINGS TO OUR OFFICE AT [phone].

## 2019-02-15 NOTE — Progress Notes (Signed)
Cardiology Office Note:    Date:  02/15/2019   ID:  James Hall, DOB 1945/12/15, MRN [id number]  PCP:  Glenford Bayley, DO  Cardiologist:  Candee Furbish, MD    Referring MD: Glenford Bayley, DO     History of Present Illness:    James Hall is a 73 y.o. male with a history of CAD s/p PCI with HTN, HL here for follow up. He had his stent placed in 2007. Has essential hypertension medications reviewed, also hyperlipidemia. He is a former smoker quit 1974. Enjoys yard maintenance, backyard projects.  He has been compliant with his medications. He has been on Plavix, likely for early generation drug-eluting stent.  Cypher stent He had seen Dr. Mare Ferrari many many years ago.  05/04/17-he was admitted to the hospital on 02/28/17 with hypertensive emergency/hypertensive encephalopathy.  Medications were changed, stopped metoprolol, added amlodipine.  Overall his blood pressures at home have been in the 130s usually.  Today it was a little bit elevated.  No chest pain, no shortness of breath.  He has been feeling overall fatigue and lack of energy and increased worry especially about his wife had a 7% of her renal function.  He recently got a new mini Cooper.  MRI of brain reviewed, no atherosclerotic changes, echocardiogram showed normal ejection fraction.  11/23/2017- overall been doing quite well occasional cough shortness of breath with activity and loss of energy. In hospital with HTN in 9/18. Since then low energy. Has to sit sown after 10 min. No CP. Mild SOB with lifting.  02/15/2019-here for the follow-up of coronary artery disease hypertension hyperlipidemia.  Prior DES Cypher stent, chronic Plavix.  Overall doing well.  Last was seen in a telemedicine visit in May 2020.  Did have some fatigue.  He joined a gym previously. Has been stable without any chest pain fevers chills nausea vomiting syncope.  Blood pressures been somewhat challenging to control.  Previously increased his metoprolol to 50  twice a day. Under 145 at home. 135 frequent.  No fevers chills nausea vomiting syncope.  Past Medical History:  Diagnosis Date  . Atherosclerotic heart disease of native coronary artery without angina pectoris   . Coronary arteriosclerosis due to lipid rich plaque   . Hypertension   . Mixed hyperlipidemia     Past Surgical History:  Procedure Laterality Date  . CAROTID STENT  2007    Current Medications: Current Meds  Medication Sig  . amLODipine (NORVASC) 10 MG tablet Take 1 tablet (10 mg total) by mouth daily.  Marland Kitchen atorvastatin (LIPITOR) 80 MG tablet Take 1 tablet (80 mg total) by mouth daily at 6 PM.  . benazepril (LOTENSIN) 20 MG tablet Take 1 tablet (20 mg total) by mouth 2 (two) times daily.  . chlorthalidone (HYGROTON) 25 MG tablet Take 1 tablet (25 mg total) by mouth daily.  . Cholecalciferol (VITAMIN D3) 50 MCG (2000 UT) TABS Take 1 tablet by mouth daily.  . clopidogrel (PLAVIX) 75 MG tablet Take 1 tablet (75 mg total) by mouth daily.  . Garlic 3614 MG CAPS Take 1 tablet by mouth daily.  . metoprolol tartrate (LOPRESSOR) 50 MG tablet Take 1 tablet (50 mg total) by mouth 2 (two) times daily.  . vitamin C (ASCORBIC ACID) 500 MG tablet Take 500 mg by mouth daily.     Allergies:   Penicillins   Social History   Socioeconomic History  . Marital status: Married    Spouse name: Not on file  .  Number of children: Not on file  . Years of education: Not on file  . Highest education level: Not on file  Occupational History  . Not on file  Social Needs  . Financial resource strain: Not on file  . Food insecurity    Worry: Not on file    Inability: Not on file  . Transportation needs    Medical: Not on file    Non-medical: Not on file  Tobacco Use  . Smoking status: Former Smoker    Types: Cigarettes    Quit date: 09/02/1972    Years since quitting: 46.4  . Smokeless tobacco: Never Used  Substance and Sexual Activity  . Alcohol use: Yes    Comment: RARELY  . Drug  use: No  . Sexual activity: Not on file  Lifestyle  . Physical activity    Days per week: Not on file    Minutes per session: Not on file  . Stress: Not on file  Relationships  . Social Musicianconnections    Talks on phone: Not on file    Gets together: Not on file    Attends religious service: Not on file    Active member of club or organization: Not on file    Attends meetings of clubs or organizations: Not on file    Relationship status: Not on file  Other Topics Concern  . Not on file  Social History Narrative  . Not on file     Family History: The patient's family history includes Other in his father. Father killed in war, shot in concentration camp.   ROS:   Please see the history of present illness. All other ROS neg  EKGs/Labs/Other Studies Reviewed:    The following studies were reviewed today: Prior office records from Dr. Nedra HaiLee reviewed. Lab work reviewed, EKG reviewed.  Nuclear stress test 12/01/11 showed normal EF no ischemia  Cardiac catheterization on 05/20/2006-Cypher stent placement 2.75 x 33 in mid left circumflex. Otherwise 50% D1, 50% distal LAD, 90% mid left circumflex, 70% lateral, 50% distal RCA, 80% PDA, EF 65%.  EKG:  EKG is ordered today.  The ekg ordered today demonstrates sinus rhythm 72 other abnormalities. Personally viewed. Prior EKG from [place], 2013 reviewed sinus rhythm with no other abnormalities.  Negative stenosis bilateral carotid Dopplers on 12/21/14.  Echocardiogram 12/21/14 showed normal EF normal mitral valve no other abnormalities. Grade 1 diastolic dysfunction.  Recent Labs: 05/30/2018: ALT 27; BUN 17; Creatinine, Ser 0.91; Hemoglobin 15.3; Platelets 277; Potassium 3.5; Sodium 142   Recent Lipid Panel    Component Value Date/Time   CHOL 152 05/30/2018 0952   TRIG 178 (H) 05/30/2018 0952   HDL 46 05/30/2018 0952   CHOLHDL 3.3 05/30/2018 0952   CHOLHDL 5.6 02/26/2017 1156   VLDL 44 (H) 02/26/2017 1156   LDLCALC 70 05/30/2018 0952     Physical Exam:    VS:  BP 140/60   Pulse 62   Ht 5\' 7"  (1.702 m)   Wt 195 lb (88.5 kg)   SpO2 97%   BMI 30.54 kg/m     Wt Readings from Last 3 Encounters:  02/15/19 195 lb (88.5 kg)  11/08/18 185 lb (83.9 kg)  05/30/18 193 lb (87.5 kg)   GEN: Well nourished, well developed, in no acute distress  HEENT: normal  Neck: no JVD, carotid bruits, or masses Cardiac: RRR; no murmurs, rubs, or gallops,no edema  Respiratory:  clear to auscultation bilaterally, normal work of breathing GI: soft, nontender,  nondistended, + BS MS: no deformity or atrophy  Skin: warm and dry, no rash Neuro:  Alert and Oriented x 3, Strength and sensation are intact Psych: euthymic mood, full affect     ASSESSMENT:    1. Coronary artery disease involving native coronary artery of native heart without angina pectoris   2. Essential hypertension   3. Pure hypercholesterolemia   4. Fatigue, unspecified type    PLAN:    In order of problems listed above:  Coronary artery disease  - Previous stent placed in 2007, Cypher. He has been on Plavix. Dr. Melvyn Neth, [facility], [employer]. Plavix alone. No ASA.   - I'm comfortable with him continuing with his Plavix. No bruising, no bleeding.  -Quite well without any anginal symptoms.  Just mild shortness of breath.  Prior lab work is been unremarkable.  - GYm closed. Pulled muscle.   Essential hypertension  -Challenging to control at times.  Medications reviewed, no changes.  Lack of energy has been an ongoing issue for him.  - Will taper off metoprolol.  Perhaps this will help him with his sense of fatigue.  I also encouraged daily exercise.  He does have some right posterior leg/buttock pain that he says was exacerbated at the gym previously.  If this does not get any better I asked him to contact his primary provider for assistance, perhaps he would need some physical therapy.   -Hypertensive encephalopathy noted in September 2018.  Resolved.  Hospital  records reviewed.  Hyperlipidemia  - Continue with atorvastatin 80 mg-high intensity statin. Lipid panel and 2019 looked excellent LDL 70.  No myalgias.  Continue to encourage weight loss.     Medication Adjustments/Labs and Tests Ordered: Current medicines are reviewed at length with the patient today.  Concerns regarding medicines are outlined above. Labs and tests ordered and medication changes are outlined in the patient instructions below:  Patient Instructions  Medication Instructions:   WE ARE GOING TO TAPER DOWN YOUR METOPROLOL AS SO:  TAKE 1/2 TAB (25 MG TOTAL) BY MOUTH TWICE DAILY FOR 3 DAYS, THEN TAKE 1/2 TAB (25 MG TOTAL) BY MOUTH DAILY FOR ONE WEEK, THEN DISCONTINUE THIS MEDICATION ALL TOGETHER THEREAFTER.   If you need a refill on your cardiac medications before your next appointment, please call your pharmacy.     Follow-Up:  2  MONTHS WITH JILL MCDANIEL NP AS A REGULAR OFFICE VISIT     Any Other Special Instructions Will Be Listed Below (If Applicable).  DR. Anne Fu WOULD LIKE FOR YOU TO MONITOR YOUR BP AND HR DAILY AND LOG THIS--PLEASE REPORT ANY ABNORMAL FINDINGS TO OUR OFFICE AT [phone].      Signed, Donato Schultz, MD  02/15/2019 9:57 AM    [facility]

## 2019-02-21 DIAGNOSIS — I2510 Atherosclerotic heart disease of native coronary artery without angina pectoris: Secondary | ICD-10-CM | POA: Diagnosis not present

## 2019-02-21 DIAGNOSIS — R197 Diarrhea, unspecified: Secondary | ICD-10-CM | POA: Diagnosis not present

## 2019-02-21 DIAGNOSIS — K219 Gastro-esophageal reflux disease without esophagitis: Secondary | ICD-10-CM | POA: Diagnosis not present

## 2019-02-21 DIAGNOSIS — B9681 Helicobacter pylori [H. pylori] as the cause of diseases classified elsewhere: Secondary | ICD-10-CM | POA: Diagnosis not present

## 2019-02-21 DIAGNOSIS — Z79899 Other long term (current) drug therapy: Secondary | ICD-10-CM | POA: Diagnosis not present

## 2019-02-21 DIAGNOSIS — I10 Essential (primary) hypertension: Secondary | ICD-10-CM | POA: Diagnosis not present

## 2019-02-21 DIAGNOSIS — E785 Hyperlipidemia, unspecified: Secondary | ICD-10-CM | POA: Diagnosis not present

## 2019-02-21 DIAGNOSIS — E119 Type 2 diabetes mellitus without complications: Secondary | ICD-10-CM | POA: Diagnosis not present

## 2019-02-23 DIAGNOSIS — R197 Diarrhea, unspecified: Secondary | ICD-10-CM | POA: Diagnosis not present

## 2019-03-20 DIAGNOSIS — Z23 Encounter for immunization: Secondary | ICD-10-CM | POA: Diagnosis not present

## 2019-04-03 DIAGNOSIS — I10 Essential (primary) hypertension: Secondary | ICD-10-CM | POA: Diagnosis not present

## 2019-04-03 DIAGNOSIS — R197 Diarrhea, unspecified: Secondary | ICD-10-CM | POA: Diagnosis not present

## 2019-04-03 DIAGNOSIS — K219 Gastro-esophageal reflux disease without esophagitis: Secondary | ICD-10-CM | POA: Diagnosis not present

## 2019-04-03 DIAGNOSIS — I2510 Atherosclerotic heart disease of native coronary artery without angina pectoris: Secondary | ICD-10-CM | POA: Diagnosis not present

## 2019-04-03 DIAGNOSIS — E785 Hyperlipidemia, unspecified: Secondary | ICD-10-CM | POA: Diagnosis not present

## 2019-05-27 NOTE — Progress Notes (Signed)
CARDIOLOGY OFFICE NOTE  Date:  05/31/2019    James Hall Date of Birth: Oct 28, 1945 Medical Record #[id number]  PCP:  Derrel Nip, NP  Cardiologist:  [facility]    Chief Complaint  Patient presents with  . Follow-up    Seen for Dr. Marlou Porch    History of Present Illness: James Hall is a 73 y.o. male who presents today for a 3 month check. Seen for Dr. Marlou Porch. Former patient of Dr. Sherryl Barters.   He has a history of known CAD with prior PCI with stent in 2007 (in [place]), HTN, HLD, and former smoker. He has been maintained on chronic DAPT for his early generation DES.   Admitted in 2018 with hypertensive crisis/encephalopathy. Medicines were adjusted. Last seen in September and was felt to be doing well. Noted that BP can be challenging at times to control. Fatigue was endorsed and Dr. Marlou Porch had recommended tapering off of beta blocker.   The patient does not have symptoms concerning for COVID-19 infection (fever, chills, cough, or new shortness of breath).   Comes in today. Here alone. He does not know his actual medicines. He did not follow the instructions from last visit and taper/stop metoprolol. He feels like he is doing ok. No chest pain. Breathing is ok. Not dizzy or lightheaded. He is not very active. Will have DOE - sounds stable. He notes his energy level is about the same. The pandemic has been hard - less active but overall he is happy with how he is doing.   Past Medical History:  Diagnosis Date  . Atherosclerotic heart disease of native coronary artery without angina pectoris   . Coronary arteriosclerosis due to lipid rich plaque   . Hypertension   . Mixed hyperlipidemia     Past Surgical History:  Procedure Laterality Date  . CAROTID STENT  2007     Medications: Current Meds  Medication Sig  . amLODipine (NORVASC) 10 MG tablet Take 1 tablet (10 mg total) by mouth daily.  Marland Kitchen atorvastatin (LIPITOR) 80 MG tablet Take 1 tablet (80 mg total)  by mouth daily at 6 PM.  . benazepril (LOTENSIN) 20 MG tablet Take 1 tablet (20 mg total) by mouth 2 (two) times daily.  . chlorthalidone (HYGROTON) 25 MG tablet Take 1 tablet (25 mg total) by mouth daily.  . Cholecalciferol (VITAMIN D3) 50 MCG (2000 UT) TABS Take 1 tablet by mouth daily.  . clopidogrel (PLAVIX) 75 MG tablet Take 1 tablet (75 mg total) by mouth daily.  . Garlic 3500 MG CAPS Take 1 tablet by mouth daily.  . metoprolol tartrate (LOPRESSOR) 50 MG tablet Take 1 tablet (50 mg total) by mouth 2 (two) times daily.  . vitamin B-12 (CYANOCOBALAMIN) 500 MCG tablet Take 500 mcg by mouth daily.  . vitamin C (ASCORBIC ACID) 500 MG tablet Take 500 mg by mouth daily.     Allergies: Allergies  Allergen Reactions  . Penicillins Rash    Has patient had a PCN reaction causing immediate rash, facial/tongue/throat swelling, SOB or lightheadedness with hypotension: No Has patient had a PCN reaction causing severe rash involving mucus membranes or skin necrosis: No Has patient had a PCN reaction that required hospitalization: No Has patient had a PCN reaction occurring within the last 10 years: No If all of the above answers are "NO", then may proceed with Cephalosporin use.    Social History: The patient  reports that he quit smoking about 46 years ago. His smoking use included  cigarettes. He has never used smokeless tobacco. He reports current alcohol use. He reports that he does not use drugs.   Family History: The patient's family history includes Other in his father.   Review of Systems: Please see the history of present illness.   All other systems are reviewed and negative.   Physical Exam: VS:  BP 118/60   Pulse (!) 58   Ht 5\' 7"  (1.702 m)   Wt 190 lb (86.2 kg)   BMI 29.76 kg/m  .  BMI Body mass index is 29.76 kg/m.  Wt Readings from Last 3 Encounters:  05/31/19 190 lb (86.2 kg)  02/15/19 195 lb (88.5 kg)  11/08/18 185 lb (83.9 kg)    General: Pleasant. Elderly. Alert  and in no acute distress.   HEENT: Normal.  Neck: Supple, no JVD, carotid bruits, or masses noted.  Cardiac: Regular rate and rhythm. Heart tones are distant. No edema.  Respiratory:  Lungs are clear to auscultation bilaterally with normal work of breathing.  GI: Soft and nontender.  MS: No deformity or atrophy. Gait and ROM intact.  Skin: Warm and dry. Color is normal.  Neuro:  Strength and sensation are intact and no gross focal deficits noted.  Psych: Alert, appropriate and with normal affect.   LABORATORY DATA:  EKG:  EKG is ordered today. This demonstrates sinus brady/sinus arrhythmia - HR is 58.  Lab Results  Component Value Date   WBC 9.8 05/30/2018   HGB 15.3 05/30/2018   HCT 47.1 05/30/2018   PLT 277 05/30/2018   GLUCOSE 128 (H) 05/30/2018   CHOL 152 05/30/2018   TRIG 178 (H) 05/30/2018   HDL 46 05/30/2018   LDLCALC 70 05/30/2018   ALT 27 05/30/2018   AST 14 05/30/2018   NA 142 05/30/2018   K 3.5 05/30/2018   CL 101 05/30/2018   CREATININE 0.91 05/30/2018   BUN 17 05/30/2018   CO2 25 05/30/2018   TSH 2.020 11/23/2017   INR 0.98 02/25/2017   HGBA1C 6.5 (H) 03/01/2017     BNP (last 3 results) No results for input(s): BNP in the last 8760 hours.  ProBNP (last 3 results) No results for input(s): PROBNP in the last 8760 hours.   Other Studies Reviewed Today:  Echo Study Conclusions 02/2017  - Left ventricle: The cavity size was normal. There was mild   concentric hypertrophy. Systolic function was normal. The   estimated ejection fraction was in the range of 60% to 65%. Wall   motion was normal; there were no regional wall motion   abnormalities. Doppler parameters are consistent with abnormal   left ventricular relaxation (grade 1 diastolic dysfunction).   Doppler parameters are consistent with indeterminate ventricular   filling pressure. - Aortic valve: Transvalvular velocity was within the normal range.   There was no stenosis. There was no  regurgitation. - Mitral valve: Transvalvular velocity was within the normal range.   There was no evidence for stenosis. There was no regurgitation. - Right ventricle: The cavity size was normal. Wall thickness was   normal. Systolic function was normal. - Atrial septum: No defect or patent foramen ovale was identified. - Tricuspid valve: There was no regurgitation.  ASSESSMENT & PLAN:    1. CAD - has had remote PCI/DES from 2007 - has been maintained on chronic DAPT - no active chest pain. Pretty sedentary but no worrisome concerns overall. No changes made today. He is trying to work on his weight and be more active -  encouragement given.   2. HTN - BP is good - has been challenging in the past - he did not taper/stop metoprolol and is ok with continuing. No changes made today. He does not really know his medicines - we called his pharmacy to clarify - I asked him to take all his bottles to his PCP to his visit next month.  3. HLD - on statin - he is not fasting today - he is seeing Wendi next month - sounds like for his physical - will defer lab to that visit.   4. Obesity - he is trying to work on this.   5. DM - sounds like he is now on Metformin - we called his pharmacy to clarify medicines and he did pick this up earlier this month and is probably taking. He does endorse chronic diarrhea -not sure if this is related - will defer to PCP.  6. GERD - has Protonix on his med rec noted - pharmacy did verify this was picked up this month as well.   7. COVID-19 Education: The signs and symptoms of COVID-19 were discussed with the patient and how to seek care for testing (follow up with PCP or arrange E-visit).  The importance of social distancing, staying at home, hand hygiene and wearing a mask when out in public were discussed today.  Current medicines are reviewed with the patient today.  The patient does not have concerns regarding medicines other than what has been noted above.  The  following changes have been made:  See above.  Labs/ tests ordered today include:    Orders Placed This Encounter  Procedures  . EKG 12-Lead     Disposition:   FU with us in about 6 months.   Patient is agreeable to this plan and will call if any problems develop in the interim.   SignedNorma Fredrickson: Yanelle Sousa, NP  05/31/2019 10:29 AM  [facility] [address] [place], [place]  [address] Phone: [phone] Fax: [phone]

## 2019-05-31 ENCOUNTER — Other Ambulatory Visit: Payer: Self-pay | Admitting: *Deleted

## 2019-05-31 ENCOUNTER — Other Ambulatory Visit: Payer: Self-pay

## 2019-05-31 ENCOUNTER — Telehealth: Payer: Self-pay | Admitting: *Deleted

## 2019-05-31 ENCOUNTER — Encounter: Payer: Self-pay | Admitting: Nurse Practitioner

## 2019-05-31 ENCOUNTER — Ambulatory Visit (INDEPENDENT_AMBULATORY_CARE_PROVIDER_SITE_OTHER): Payer: Medicare Other | Admitting: Nurse Practitioner

## 2019-05-31 VITALS — BP 118/60 | HR 58 | Ht 67.0 in | Wt 190.0 lb

## 2019-05-31 DIAGNOSIS — E7800 Pure hypercholesterolemia, unspecified: Secondary | ICD-10-CM

## 2019-05-31 DIAGNOSIS — I2510 Atherosclerotic heart disease of native coronary artery without angina pectoris: Secondary | ICD-10-CM

## 2019-05-31 DIAGNOSIS — Z79899 Other long term (current) drug therapy: Secondary | ICD-10-CM | POA: Diagnosis not present

## 2019-05-31 DIAGNOSIS — I10 Essential (primary) hypertension: Secondary | ICD-10-CM | POA: Diagnosis not present

## 2019-05-31 DIAGNOSIS — Z7189 Other specified counseling: Secondary | ICD-10-CM

## 2019-05-31 MED ORDER — PANTOPRAZOLE SODIUM 20 MG PO TBEC: 20.0000 mg | DELAYED_RELEASE_TABLET | Freq: Every day | ORAL | 11 refills | Status: DC

## 2019-05-31 MED ORDER — METFORMIN HCL 500 MG PO TABS: 500.0000 mg | ORAL_TABLET | Freq: Two times a day (BID) | ORAL | 11 refills | Status: AC

## 2019-05-31 NOTE — Telephone Encounter (Signed)
S/w Cecille Rubin at the [facility] in [place] @ [phone] confirmed all pt's medication's including metformin (500 mg) bid and Protonix (20 mg ) daily. Truitt Merle, NP is aware.

## 2019-05-31 NOTE — Patient Instructions (Addendum)
After Visit Summary:  We will be checking the following labs today - NONE  Go fasting to your next visit with Wendi so you can have labs checked.    Medication Instructions:    Continue with your current medicines. BUT  I want you to take all your medicine bottles to your appointment with Ellard Artis next month so she can make sure everything is correct   If you need a refill on your cardiac medications before your next appointment, please call your pharmacy.     Testing/Procedures To Be Arranged:  N/A  Follow-Up:   See Dr. Marlou Porch in 6 months -  You will receive a reminder letter in the mail two months in advance. If you don't receive a letter, please call our office to schedule the follow-up appointment.     At [facility], you and your health needs are our priority.  As part of our continuing mission to provide you with exceptional heart care, we have created designated Provider Care Teams.  These Care Teams include your primary Cardiologist (physician) and Advanced Practice Providers (APPs -  Physician Assistants and Nurse Practitioners) who all work together to provide you with the care you need, when you need it.  Special Instructions:  . Stay safe, stay home, wash your hands for at least 20 seconds and wear a mask when out in public.  . It was good to talk with you today.    Call the [place] office at [phone] if you have any questions, problems or concerns.

## 2019-06-23 ENCOUNTER — Other Ambulatory Visit: Payer: Self-pay | Admitting: Nurse Practitioner

## 2019-08-05 ENCOUNTER — Other Ambulatory Visit: Payer: Self-pay | Admitting: Nurse Practitioner

## 2019-11-20 ENCOUNTER — Ambulatory Visit (INDEPENDENT_AMBULATORY_CARE_PROVIDER_SITE_OTHER): Payer: Medicare Other | Admitting: Cardiology

## 2019-11-20 ENCOUNTER — Other Ambulatory Visit: Payer: Self-pay

## 2019-11-20 ENCOUNTER — Encounter: Payer: Self-pay | Admitting: Cardiology

## 2019-11-20 ENCOUNTER — Encounter: Payer: Self-pay | Admitting: *Deleted

## 2019-11-20 VITALS — BP 110/60 | HR 67 | Ht 67.0 in | Wt 186.0 lb

## 2019-11-20 DIAGNOSIS — I2510 Atherosclerotic heart disease of native coronary artery without angina pectoris: Secondary | ICD-10-CM | POA: Diagnosis not present

## 2019-11-20 DIAGNOSIS — I10 Essential (primary) hypertension: Secondary | ICD-10-CM

## 2019-11-20 DIAGNOSIS — E7800 Pure hypercholesterolemia, unspecified: Secondary | ICD-10-CM

## 2019-11-20 NOTE — Progress Notes (Signed)
Cardiology Office Note:    Date:  11/20/2019   ID:  James Hall, DOB 09-06-1945, MRN [id number]  PCP:  Lenell Antu, DO  [facility] Cardiologist:  Donato Schultz, MD  [facility] Electrophysiologist:  None   Referring MD: Lenell Antu, DO     History of Present Illness:    James Hall is a 74 y.o. male  a history of CAD s/p PCI with HTN, HL here for follow up. He had his stent placed in 2007. Has essential hypertension medications reviewed, also hyperlipidemia. He is a former smoker quit 1974. Enjoys yard maintenance, backyard projects.  He has been compliant with his medications. He has been on Plavix, likely for early generation drug-eluting stent.  Cypher stent He had seen Dr. Patty Sermons many many years ago.  05/04/17-he was admitted to the hospital on 02/28/17 with hypertensive emergency/hypertensive encephalopathy.  Medications were changed, stopped metoprolol, added amlodipine.  Overall his blood pressures at home have been in the 130s usually.  Today it was a little bit elevated.  No chest pain, no shortness of breath.  He has been feeling overall fatigue and lack of energy and increased worry especially about his wife had a 7% of her renal function.  He recently got a new mini Cooper.  MRI of brain reviewed, no atherosclerotic changes, echocardiogram showed normal ejection fraction.  11/23/2017- overall been doing quite well occasional cough shortness of breath with activity and loss of energy. In hospital with HTN in 9/18. Since then low energy. Has to sit sown after 10 min. No CP. Mild SOB with lifting.  02/15/2019-here for the follow-up of coronary artery disease hypertension hyperlipidemia.  Prior DES Cypher stent, chronic Plavix.  Overall doing well.  Last was seen in a telemedicine visit in May 2020.  Did have some fatigue.  He joined a gym previously. Has been stable without any chest pain fevers chills nausea vomiting syncope.  Blood pressures been somewhat  challenging to control.  Previously increased his metoprolol to 50 twice a day. Under 145 at home. 135 frequent.  No fevers chills nausea vomiting syncope.  11/20/2019-here for follow-up.  Prior DES Cypher stent chronic Plavix use. No CP, but mild SOB. Diarrhea!!    Past Medical History:  Diagnosis Date  . Atherosclerotic heart disease of native coronary artery without angina pectoris   . Coronary arteriosclerosis due to lipid rich plaque   . Hypertension   . Mixed hyperlipidemia     Past Surgical History:  Procedure Laterality Date  . CAROTID STENT  2007    Current Medications: Current Meds  Medication Sig  . amLODipine (NORVASC) 10 MG tablet TAKE 1 TABLET (10 MG TOTAL) BY MOUTH ONCE DAILY  . atorvastatin (LIPITOR) 80 MG tablet TAKE 1 TABLET BY MOUTH ONCE DAILY AT 6 PM  . benazepril (LOTENSIN) 20 MG tablet Take 1 tablet by mouth twice daily  . chlorthalidone (HYGROTON) 25 MG tablet Take 1 tablet (25 mg total) by mouth daily.  . Cholecalciferol (VITAMIN D3) 50 MCG (2000 UT) TABS Take 1 tablet by mouth daily.  . clopidogrel (PLAVIX) 75 MG tablet Take 1 tablet by mouth once daily  . Garlic 1200 MG CAPS Take 1 tablet by mouth daily.  . metFORMIN (GLUCOPHAGE) 500 MG tablet Take 1 tablet (500 mg total) by mouth 2 (two) times daily with a meal.  . metoprolol tartrate (LOPRESSOR) 50 MG tablet Take 1 tablet (50 mg total) by mouth 2 (two) times daily.  . pantoprazole (PROTONIX) 20  MG tablet Take 1 tablet (20 mg total) by mouth daily.  . vitamin B-12 (CYANOCOBALAMIN) 500 MCG tablet Take 500 mcg by mouth daily.  . vitamin C (ASCORBIC ACID) 500 MG tablet Take 500 mg by mouth daily.     Allergies:   Penicillins   Social History   Socioeconomic History  . Marital status: Married    Spouse name: Not on file  . Number of children: Not on file  . Years of education: Not on file  . Highest education level: Not on file  Occupational History  . Not on file  Tobacco Use  . Smoking status:  Former Smoker    Types: Cigarettes    Quit date: 09/02/1972    Years since quitting: 47.2  . Smokeless tobacco: Never Used  Substance and Sexual Activity  . Alcohol use: Yes    Comment: RARELY  . Drug use: No  . Sexual activity: Not on file  Other Topics Concern  . Not on file  Social History Narrative  . Not on file   Social Determinants of Health   Financial Resource Strain:   . Difficulty of Paying Living Expenses:   Food Insecurity:   . Worried About Programme researcher, broadcasting/film/video in the Last Year:   . Barista in the Last Year:   Transportation Needs:   . Freight forwarder (Medical):   Marland Kitchen Lack of Transportation (Non-Medical):   Physical Activity:   . Days of Exercise per Week:   . Minutes of Exercise per Session:   Stress:   . Feeling of Stress :   Social Connections:   . Frequency of Communication with Friends and Family:   . Frequency of Social Gatherings with Friends and Family:   . Attends Religious Services:   . Active Member of Clubs or Organizations:   . Attends Banker Meetings:   Marland Kitchen Marital Status:      Family History: The patient's family history includes Other in his father.  ROS:   Please see the history of present illness.     All other systems reviewed and are negative.  EKGs/Labs/Other Studies Reviewed:     Recent Labs: No results found for requested labs within last 8760 hours.  Recent Lipid Panel    Component Value Date/Time   CHOL 152 05/30/2018 0952   TRIG 178 (H) 05/30/2018 0952   HDL 46 05/30/2018 0952   CHOLHDL 3.3 05/30/2018 0952   CHOLHDL 5.6 02/26/2017 1156   VLDL 44 (H) 02/26/2017 1156   LDLCALC 70 05/30/2018 0952    Physical Exam:    VS:  BP 110/60   Pulse 67   Ht 5\' 7"  (1.702 m)   Wt 186 lb (84.4 kg)   SpO2 97%   BMI 29.13 kg/m     Wt Readings from Last 3 Encounters:  11/20/19 186 lb (84.4 kg)  05/31/19 190 lb (86.2 kg)  02/15/19 195 lb (88.5 kg)     GEN:  Well nourished, well developed in no  acute distress HEENT: Normal NECK: No JVD; No carotid bruits LYMPHATICS: No lymphadenopathy CARDIAC: RRR, no murmurs, rubs, gallops RESPIRATORY:  Clear to auscultation without rales, wheezing or rhonchi  ABDOMEN: Soft, non-tender, non-distended MUSCULOSKELETAL:  No edema; No deformity  SKIN: Warm and dry NEUROLOGIC:  Alert and oriented x 3 PSYCHIATRIC:  Normal affect   ASSESSMENT:    1. Coronary artery disease involving native coronary artery of native heart without angina pectoris   2. Essential  hypertension   3. Pure hypercholesterolemia    PLAN:    In order of problems listed above:  Ongoing diarrhea -He sees Dr. Elta Guadeloupe Appler in [place].  He would like to proceed with colonoscopy.  I do think this seems reasonable.  He would have to stop his Plavix for 5 days prior to colonoscopy and then resume the day after.  His risk of stent thrombosis is very small.  He understands this and is willing to take the risk.  Has an early generation drug-eluting stent. -We will send correspondence to Dr. Katherene Ponto.  Coronary artery disease -Previous stent placed in 2007, Cypher. He has been on Plavix. Dr. Bobby Rumpf, [place], [place]. -Plavix monotherapy -Mild shortness of breath still continues.  Continue with conditioning efforts.  Essential hypertension -Well controlled currently.  No changes made.  Hyperlipidemia -Atorvastatin 80, LDL 70 in 2019.    Medication Adjustments/Labs and Tests Ordered: Current medicines are reviewed at length with the patient today.  Concerns regarding medicines are outlined above.  No orders of the defined types were placed in this encounter.  No orders of the defined types were placed in this encounter.   Patient Instructions  Medication Instructions:  The current medical regimen is effective;  continue present plan and medications.  *If you need a refill on your cardiac medications before your next appointment, please call your  pharmacy*  Follow-Up: At [facility], you and your health needs are our priority.  As part of our continuing mission to provide you with exceptional heart care, we have created designated Provider Care Teams.  These Care Teams include your primary Cardiologist (physician) and Advanced Practice Providers (APPs -  Physician Assistants and Nurse Practitioners) who all work together to provide you with the care you need, when you need it.  We recommend signing up for the patient portal called "MyChart".  Sign up information is provided on this After Visit Summary.  MyChart is used to connect with patients for Virtual Visits (Telemedicine).  Patients are able to view lab/test results, encounter notes, upcoming appointments, etc.  Non-urgent messages can be sent to your provider as well.   To learn more about what you can do with MyChart, go to [email or web address].    Your next appointment:   12 month(s)  The format for your next appointment:   In Person  Provider:   Candee Furbish, MD  Thank you for choosing [facility]!!         Signed, Candee Furbish, MD  11/20/2019 4:15 PM    [place]

## 2019-11-20 NOTE — Patient Instructions (Signed)
Medication Instructions:  The current medical regimen is effective;  continue present plan and medications.  *If you need a refill on your cardiac medications before your next appointment, please call your pharmacy*  Follow-Up: At [facility], you and your health needs are our priority.  As part of our continuing mission to provide you with exceptional heart care, we have created designated Provider Care Teams.  These Care Teams include your primary Cardiologist (physician) and Advanced Practice Providers (APPs -  Physician Assistants and Nurse Practitioners) who all work together to provide you with the care you need, when you need it.  We recommend signing up for the patient portal called "MyChart".  Sign up information is provided on this After Visit Summary.  MyChart is used to connect with patients for Virtual Visits (Telemedicine).  Patients are able to view lab/test results, encounter notes, upcoming appointments, etc.  Non-urgent messages can be sent to your provider as well.   To learn more about what you can do with MyChart, go to [email or web address].    Your next appointment:   12 month(s)  The format for your next appointment:   In Person  Provider:   Mark Skains, MD   Thank you for choosing [facility]!!      

## 2020-01-27 ENCOUNTER — Other Ambulatory Visit: Payer: Self-pay | Admitting: Cardiology

## 2020-07-13 ENCOUNTER — Other Ambulatory Visit: Payer: Self-pay | Admitting: Nurse Practitioner

## 2020-08-03 ENCOUNTER — Other Ambulatory Visit: Payer: Self-pay | Admitting: Nurse Practitioner

## 2020-11-14 ENCOUNTER — Other Ambulatory Visit: Payer: Self-pay | Admitting: Cardiology

## 2020-12-06 ENCOUNTER — Emergency Department (HOSPITAL_BASED_OUTPATIENT_CLINIC_OR_DEPARTMENT_OTHER)
Admission: EM | Admit: 2020-12-06 | Discharge: 2020-12-06 | Disposition: A | Discharge status: Home / Self Care | Payer: Medicare HMO | Attending: Emergency Medicine | Admitting: Emergency Medicine

## 2020-12-06 ENCOUNTER — Encounter (HOSPITAL_BASED_OUTPATIENT_CLINIC_OR_DEPARTMENT_OTHER): Payer: Self-pay | Admitting: Emergency Medicine

## 2020-12-06 ENCOUNTER — Other Ambulatory Visit: Payer: Self-pay

## 2020-12-06 DIAGNOSIS — Z79899 Other long term (current) drug therapy: Secondary | ICD-10-CM | POA: Insufficient documentation

## 2020-12-06 DIAGNOSIS — I2510 Atherosclerotic heart disease of native coronary artery without angina pectoris: Secondary | ICD-10-CM | POA: Diagnosis not present

## 2020-12-06 DIAGNOSIS — I10 Essential (primary) hypertension: Secondary | ICD-10-CM | POA: Insufficient documentation

## 2020-12-06 DIAGNOSIS — Z87891 Personal history of nicotine dependence: Secondary | ICD-10-CM | POA: Insufficient documentation

## 2020-12-06 DIAGNOSIS — Z7902 Long term (current) use of antithrombotics/antiplatelets: Secondary | ICD-10-CM | POA: Diagnosis not present

## 2020-12-06 DIAGNOSIS — R519 Headache, unspecified: Secondary | ICD-10-CM | POA: Diagnosis present

## 2020-12-06 DIAGNOSIS — G500 Trigeminal neuralgia: Secondary | ICD-10-CM | POA: Diagnosis not present

## 2020-12-06 MED ORDER — CARBAMAZEPINE ER 100 MG PO TB12: 100.0000 mg | ORAL_TABLET | Freq: Two times a day (BID) | ORAL | 1 refills | Status: DC

## 2020-12-06 MED ORDER — CARBAMAZEPINE ER 100 MG PO CP12: 100.0000 mg | ORAL_CAPSULE | Freq: Once | ORAL | Status: DC

## 2020-12-06 NOTE — Progress Notes (Signed)
Teleneurology consult completed. Patient presents with paroxysmal pain in R V1 distribution c/w trigeminal neuralgia. He was recently diagnosed with this by OSH ophtho but has not undergone MRI brain nor been started on tx. Pt reports no neurologic deficits and examination by video was unremarkable.  Recommendations Amb referral to neurology; pt will need outpatient MRI brain wwo but this does not need to be done emergently Confirm med list with patient; if no contraindication start carbamazepine 100mg  bid for trigeminal neuralgia  Full consult note to follow.  , MD [facility] [phone]  If 7pm- 7am, please page neurology on call as listed in AMION.

## 2020-12-06 NOTE — ED Provider Notes (Signed)
[facility] EMERGENCY DEPT Provider Note   CSN: [id number] Arrival date & time: 12/06/20  1734     History Chief Complaint  Patient presents with   Facial Pain    James Hall is a 75 y.o. male.  Patient has been struggling with right-sided face pain and twitching for about 2 weeks.  [place] CT head sedimentation rate done both were negative for anything acute.  Did show evidence of an old CVA.  They referred him to the eye center at [facility]: [facility]  He was seen there on June 22 they evaluated his eyes and also alluded that the most likely diagnosis was trigeminal neuralgia on the right side.  However I did not prescribe any medicines but did refer him to neurology.  But patient has not heard anything from neurology.  So brought in here for help with the discomfort and some medication.  Patient without any fevers no headache.      Past Medical History:  Diagnosis Date   Atherosclerotic heart disease of native coronary artery without angina pectoris    Coronary arteriosclerosis due to lipid rich plaque    Hypertension    Mixed hyperlipidemia     Patient Active Problem List   Diagnosis Date Noted   Mixed hyperlipidemia    Hypertensive encephalopathy    Prediabetes    Leukocytosis    Hypertensive emergency 02/25/2017   Coronary artery disease involving native coronary artery of native heart without angina pectoris 10/15/2016   Essential hypertension 10/15/2016   Pure hypercholesterolemia 10/15/2016    Past Surgical History:  Procedure Laterality Date   CAROTID STENT  2007       Family History  Problem Relation Age of Onset   Other Father        WAR CASUALTY    Social History   Tobacco Use   Smoking status: Former    Pack years: 0.00    Types: Cigarettes    Quit date: 09/02/1972    Years since quitting: 48.2   Smokeless tobacco: Never  Vaping Use   Vaping Use: Never used  Substance Use Topics   Alcohol use: Yes    Comment:  RARELY   Drug use: No    Home Medications Prior to Admission medications   Medication Sig Start Date End Date Taking? Authorizing Provider  amLODipine (NORVASC) 10 MG tablet Take 1 tablet by mouth once daily 07/15/20  Yes Rosalio Macadamia, NP  atorvastatin (LIPITOR) 80 MG tablet TAKE 1 TABLET BY MOUTH ONCE DAILY AT  6  PM 07/15/20  Yes Rosalio Macadamia, NP  benazepril (LOTENSIN) 20 MG tablet Take 1 tablet by mouth twice daily 07/15/20  Yes Rosalio Macadamia, NP  carbamazepine (TEGRETOL XR) 100 MG 12 hr tablet Take 1 tablet (100 mg total) by mouth 2 (two) times daily. 12/06/20  Yes Vanetta Mulders, MD  chlorthalidone (HYGROTON) 25 MG tablet Take 1 tablet (25 mg total) by mouth daily. 05/30/18  Yes Rosalio Macadamia, NP  Cholecalciferol (VITAMIN D3) 50 MCG (2000 UT) TABS Take 1 tablet by mouth daily.   Yes [provider]  clopidogrel (PLAVIX) 75 MG tablet Take 1 tablet by mouth once daily 08/05/20  Yes Rosalio Macadamia, NP  famotidine (PEPCID) 40 MG tablet Take 40 mg by mouth 2 (two) times daily.   Yes [provider]  Garlic 1200 MG CAPS Take 1 tablet by mouth daily.   Yes [provider]  metFORMIN (GLUCOPHAGE) 500  MG tablet Take 1 tablet (500 mg total) by mouth 2 (two) times daily with a meal. 05/31/19  Yes Rosalio Macadamia, NP  metoprolol tartrate (LOPRESSOR) 50 MG tablet Take 1 tablet by mouth twice daily 11/14/20  Yes Jake Bathe, MD  vitamin B-12 (CYANOCOBALAMIN) 500 MCG tablet Take 500 mcg by mouth daily.   Yes [provider]  vitamin C (ASCORBIC ACID) 500 MG tablet Take 500 mg by mouth daily.   Yes [provider]  pantoprazole (PROTONIX) 20 MG tablet Take 1 tablet (20 mg total) by mouth daily. 05/31/19   Rosalio Macadamia, NP    Allergies    Penicillins  Review of Systems   Review of Systems  Constitutional:  Negative for chills and fever.  HENT:  Negative for ear pain and sore throat.   Eyes:  Negative for photophobia, pain, discharge,  redness and visual disturbance.  Respiratory:  Negative for cough and shortness of breath.   Cardiovascular:  Negative for chest pain and palpitations.  Gastrointestinal:  Negative for abdominal pain and vomiting.  Genitourinary:  Negative for dysuria and hematuria.  Musculoskeletal:  Negative for arthralgias and back pain.  Skin:  Negative for color change and rash.  Neurological:  Negative for seizures, syncope and headaches.  All other systems reviewed and are negative.  Physical Exam Updated Vital Signs BP (!) 198/78 (BP Location: Left Arm)   Pulse 68   Temp 98.4 F (36.9 C) (Oral)   Resp 16   Ht 1.702 m (5\' 7" )   Wt 81.6 kg   SpO2 96%   BMI 28.19 kg/m   Physical Exam Vitals and nursing note reviewed.  Constitutional:      General: He is not in acute distress.    Appearance: Normal appearance. He is well-developed.  HENT:     Head: Normocephalic and atraumatic.  Eyes:     Extraocular Movements: Extraocular movements intact.     Conjunctiva/sclera: Conjunctivae normal.     Pupils: Pupils are equal, round, and reactive to light.  Cardiovascular:     Rate and Rhythm: Normal rate and regular rhythm.     Heart sounds: No murmur heard. Pulmonary:     Effort: Pulmonary effort is normal. No respiratory distress.     Breath sounds: Normal breath sounds.  Abdominal:     Palpations: Abdomen is soft.     Tenderness: There is no abdominal tenderness.  Musculoskeletal:        General: Normal range of motion.     Cervical back: Normal range of motion and neck supple.  Skin:    General: Skin is warm and dry.     Capillary Refill: Capillary refill takes less than 2 seconds.  Neurological:     General: No focal deficit present.     Mental Status: He is alert and oriented to person, place, and time.     Cranial Nerves: No cranial nerve deficit.     Sensory: No sensory deficit.     Motor: No weakness.    ED Results / Procedures / Treatments   Labs (all labs ordered are  listed, but only abnormal results are displayed) Labs Reviewed - No data to display  EKG None  Radiology No results found.  Procedures Procedures   CRITICAL CARE Performed by: Total critical care time: 35 minutes Critical care time was exclusive of separately billable procedures and treating other patients. Critical care was necessary to treat or prevent imminent or life-threatening deterioration. Critical  care was time spent personally by me on the following activities: development of treatment plan with patient and/or surrogate as well as nursing, discussions with consultants, evaluation of patient's response to treatment, examination of patient, obtaining history from patient or surrogate, ordering and performing treatments and interventions, ordering and review of laboratory studies, ordering and review of radiographic studies, pulse oximetry and re-evaluation of patient's condition.   Medications Ordered in ED Medications  Carbamazepine (EQUETRO) 12 hr capsule 100 mg (has no administration in time range)    ED Course  I have reviewed the triage vital signs and the nursing notes.  Pertinent labs & imaging results that were available during my care of the patient were reviewed by me and considered in my medical decision making (see chart for details).    MDM Rules/Calculators/A&P                         Had teleneurology evaluate the patient they concur with the diagnosis of right-sided trigeminal neuralgia.  They are recommending outpatient MRI.  This can be done through neurology.  Also they are recommending starting 100 mg of Tegretol twice daily.  This is all been ordered.  Patient given referral to [facility] neurology.  Final Clinical Impression(s) / ED Diagnoses Final diagnoses:  Trigeminal neuralgia    Rx / DC Orders ED Discharge Orders          Ordered    carbamazepine (TEGRETOL XR) 100 MG 12 hr tablet  2 times daily        12/06/20 1932              Vanetta Mulders, MD 12/06/20 1950

## 2020-12-06 NOTE — ED Notes (Signed)
Neurotology Teleconsult at bed side

## 2020-12-06 NOTE — ED Triage Notes (Signed)
Pt is from home. Pt states that he has had intermittent  right eye pain for the past two weeks . Pt was recently diagnosed with trigeminal neuralgica.

## 2020-12-06 NOTE — Consult Note (Signed)
NEUROLOGY TELECONSULTATION NOTE   Date of service: December 06, 2020 Patient Name: James Hall MRN:  [id number] DOB:  [birth year] Reason for consult: trigeminal neuralgia Requesting provider: Vanetta Mulders MD Consult Participants: myself, bedside RN, patient, son in law Location of the provider: [facility] Location of the patient: [facility]   This consult was provided via telemedicine with 2-way video and audio communication. The patient/family was informed that care would be provided in this way and agreed to receive care in this manner.   _ _ _   _ __   _ __ _ _  __ __   _ __   __ _  History of Present Illness   James Hall is a 75 y.o. male with PMH significant for  has a past medical history of Atherosclerotic heart disease of native coronary artery without angina pectoris, Coronary arteriosclerosis due to lipid rich plaque, Hypertension, and Mixed hyperlipidemia. who presents with paroxysmal stabbing pain in R V1 distribution x2 weeks. Neurology consulted for initial mgmt trigeminal neuralgia.  Patient presents with paroxysmal pain in R V1 distribution c/w trigeminal neuralgia. Sx started 2 weeks ago without apparent trigger. Multiple attacks per day, sometimes triggered by touching face or brushing teeth. He was recently diagnosed with this by ophtho at [facility] but has not undergone MRI brain nor been started on tx. I did review the ophtho notes in [facility], eye examination was unremarkable and he was referred to outpatient neurology but nobody has called him yet for appt and pain is severe when paroxysms occur so RN son-in-law brought him to ED to be started on treatment. Pt reports no neurologic deficits and examination by video was unremarkable.   ROS   10 point review of systems was performed and was negative except as described in HPI.  Past History   Past Medical History:  Diagnosis Date   Atherosclerotic heart disease of native coronary artery  without angina pectoris    Coronary arteriosclerosis due to lipid rich plaque    Hypertension    Mixed hyperlipidemia    Past Surgical History:  Procedure Laterality Date   CAROTID STENT  2007   Family History  Problem Relation Age of Onset   Other Father        WAR CASUALTY   Social History   Socioeconomic History   Marital status: Married    Spouse name: Not on file   Number of children: Not on file   Years of education: Not on file   Highest education level: Not on file  Occupational History   Not on file  Tobacco Use   Smoking status: Former    Pack years: 0.00    Types: Cigarettes    Quit date: 09/02/1972    Years since quitting: 48.2   Smokeless tobacco: Never  Vaping Use   Vaping Use: Never used  Substance and Sexual Activity   Alcohol use: Yes    Comment: RARELY   Drug use: No   Sexual activity: Not on file  Other Topics Concern   Not on file  Social History Narrative   Not on file   Social Determinants of Health   Financial Resource Strain: Not on file  Food Insecurity: Not on file  Transportation Needs: Not on file  Physical Activity: Not on file  Stress: Not on file  Social Connections: Not on file   Allergies  Allergen Reactions   Penicillins Rash    Has patient had a PCN reaction causing immediate  rash, facial/tongue/throat swelling, SOB or lightheadedness with hypotension: No Has patient had a PCN reaction causing severe rash involving mucus membranes or skin necrosis: No Has patient had a PCN reaction that required hospitalization: No Has patient had a PCN reaction occurring within the last 10 years: No If all of the above answers are "NO", then may proceed with Cephalosporin use.    Medications   Current Outpatient Medications  Medication Instructions   amLODipine (NORVASC) 10 MG tablet Take 1 tablet by mouth once daily   atorvastatin (LIPITOR) 80 MG tablet TAKE 1 TABLET BY MOUTH ONCE DAILY AT  6  PM   benazepril (LOTENSIN) 20 MG  tablet Take 1 tablet by mouth twice daily   carbamazepine (TEGRETOL XR) 100 mg, Oral, 2 times daily   chlorthalidone (HYGROTON) 25 mg, Oral, Daily   Cholecalciferol (VITAMIN D3) 50 MCG (2000 UT) TABS 1 tablet, Oral, Daily   clopidogrel (PLAVIX) 75 MG tablet Take 1 tablet by mouth once daily   famotidine (PEPCID) 40 mg, Oral, 2 times daily   Garlic 1200 MG CAPS 1 tablet, Oral, Daily   metFORMIN (GLUCOPHAGE) 500 mg, Oral, 2 times daily with meals   metoprolol tartrate (LOPRESSOR) 50 MG tablet Take 1 tablet by mouth twice daily   pantoprazole (PROTONIX) 20 mg, Oral, Daily   vitamin B-12 (CYANOCOBALAMIN) 500 mcg, Oral, Daily   vitamin C (ASCORBIC ACID) 500 mg, Oral, Daily      Vitals   Vitals:   12/06/20 1744 12/06/20 1745 12/06/20 1900  BP: (!) 173/62  (!) 198/78  Pulse: 71  68  Resp: 16  16  Temp: 98.4 F (36.9 C)    TempSrc: Oral    SpO2: 96%  96%  Weight:  81.6 kg   Height:  5\' 7"  (1.702 m)      Body mass index is 28.19 kg/m.  Physical Exam   Exam performed over telemedicine with 2-way video and audio communication and with assistance of bedside RN  Physical Exam Gen: A&O x4, NAD Resp: normal WOB CV: extremities appear well-perfused  Neuro: *MS: A&O x4. Follows multi-step commands.  *Speech: nondysarthric, no aphasia, able to name and repeat *CN: PERRL 74mm, EOMI, VFF by confrontation, sensation intact, smile symmetric, hearing intact to voice *Motor:   Normal bulk.  No tremor, rigidity or bradykinesia. No pronator drift. All extremities appear full-strength and symmetric. *Sensory: SILT. Symmetric. No double-simultaneous extinction.  *Coordination:  Finger-to-nose, heel-to-shin, rapid alternating motions were intact. *Reflexes:  UTA 2/2 tele-exam *Gait: deferred    Labs   CBC: No results for input(s): WBC, NEUTROABS, HGB, HCT, MCV, PLT in the last 168 hours.  Basic Metabolic Panel:  Lab Results  Component Value Date   NA 142 05/30/2018   K 3.5 05/30/2018    CO2 25 05/30/2018   GLUCOSE 128 (H) 05/30/2018   BUN 17 05/30/2018   CREATININE 0.91 05/30/2018   CALCIUM 9.7 05/30/2018   GFRNONAA 83 05/30/2018   GFRAA 96 05/30/2018   Lipid Panel:  Lab Results  Component Value Date   LDLCALC 70 05/30/2018   HgbA1c:  Lab Results  Component Value Date   HGBA1C 6.5 (H) 03/01/2017   Urine Drug Screen: No results found for: LABOPIA, COCAINSCRNUR, LABBENZ, AMPHETMU, THCU, LABBARB  Alcohol Level     Component Value Date/Time   ETH <5 02/25/2017 1647     Impression   Patient presents with paroxysmal pain in R V1 distribution c/w trigeminal neuralgia. He was recently diagnosed with this by OSH  ophtho but has not undergone MRI brain nor been started on tx. Pt reports no neurologic deficits and examination by video was unremarkable.  Recommendations   Amb referral to neurology; pt will need outpatient MRI brain wwo but this does not need to be done emergently. He has no focal neurologic deficits at this time.  Confirm med list with patient; if no contraindication start carbamazepine 100mg  bid for trigeminal neuralgia  Recommendations d/w Dr. by phone ______________________________________________________________________   Thank you for the opportunity to take part in the care of this patient. If you have any further questions, please contact the neurology consultation attending.  Signed,  Deretha Emory, MD [facility] [phone]  If 7pm- 7am, please page neurology on call as listed in [facility].

## 2020-12-06 NOTE — Discharge Instructions (Addendum)
Take the Tegretol 100 mg twice a day or every 12 hours for the next 7 days should continue it if you need to.  Make an appointment to follow-up with low [facility] neurology.  Information provided above.

## 2020-12-09 ENCOUNTER — Encounter: Payer: Self-pay | Admitting: Neurology

## 2020-12-17 ENCOUNTER — Telehealth (HOSPITAL_BASED_OUTPATIENT_CLINIC_OR_DEPARTMENT_OTHER): Payer: Self-pay | Admitting: Emergency Medicine

## 2020-12-17 MED ORDER — CARBAMAZEPINE ER 100 MG PO TB12: 100.0000 mg | ORAL_TABLET | Freq: Two times a day (BID) | ORAL | 0 refills | Status: DC

## 2020-12-17 NOTE — Telephone Encounter (Signed)
Patient's son called stating has been tolerating Tegretol well and it has been helping but ED Rx has run out and it will be several weeks before he can see PCP or Neurology. Will send a refill to his pharmacy.

## 2021-02-07 ENCOUNTER — Other Ambulatory Visit: Payer: Self-pay

## 2021-02-07 ENCOUNTER — Encounter: Payer: Self-pay | Admitting: Neurology

## 2021-02-07 ENCOUNTER — Ambulatory Visit: Payer: Medicare HMO | Admitting: Neurology

## 2021-02-07 VITALS — BP 162/74 | HR 69 | Ht 67.0 in | Wt 173.0 lb

## 2021-02-07 DIAGNOSIS — L270 Generalized skin eruption due to drugs and medicaments taken internally: Secondary | ICD-10-CM

## 2021-02-07 DIAGNOSIS — G500 Trigeminal neuralgia: Secondary | ICD-10-CM

## 2021-02-07 MED ORDER — GABAPENTIN 300 MG PO CAPS: ORAL_CAPSULE | ORAL | 5 refills | Status: DC

## 2021-02-07 NOTE — Patient Instructions (Addendum)
Stop carbamazepine due to rash  Start gabapentin 300mg  at bedtime x 3 days, then take 1 tablet twice daily  Start benadryl 25mg  every 6 hours for rash.  Follow-up with primary care doctor for rash. If it gets worse over the weekend, please go to urgent care  We will order MRI face - trigeminal nerve protocol  In two weeks, call me if your symptoms do not improve   Return to clinic in 2 months

## 2021-02-07 NOTE — Progress Notes (Signed)
[facility] Neurology Division Clinic Note - Initial Visit   Date: 02/07/21  James Hall MRN: [id number] DOB: 06-18-1945   Dear Dr. Deretha Emory:  Thank you for your kind referral of James Hall for consultation of right facial pain. Although his history is well known to you, please allow [place] to reiterate it for the purpose of our medical record. The patient was accompanied to the clinic by self.    History of Present Illness: James Hall is a 75 y.o. right-handed male with CAD, hypertension, and hyperlipidemia presenting for evaluation of right facial pain.   Starting in June 2022, he began having spells of right facial pain, descrbied as shooting, needle-like pain, starting behind the right eye, which has transitioned to involve the right temple and radiates into the upper jaw/tetth.  Initially, he was afraid to brush his teeth, chew, or even allow water to hit that side of the face.   He was seen in the ER and started on carbamazepine 100mg  BID.  This has reduced the intensity of pain, but not eleiminated it.  He continues to have daily spells of pain. No vision changes or facial weakness.  He has noticed a rash over the legs over the past week which is new.   Out-side paper records, electronic medical record, and images have been reviewed where available and summarized as:  Lab Results  Component Value Date   HGBA1C 6.5 (H) 03/01/2017   No results found for: 03/03/2017 Lab Results  Component Value Date   TSH 2.020 11/23/2017   No results found for: ESRSEDRATE, POCTSEDRATE  Past Medical History:  Diagnosis Date   Atherosclerotic heart disease of native coronary artery without angina pectoris    Coronary arteriosclerosis due to lipid rich plaque    Hypertension    Mixed hyperlipidemia     Past Surgical History:  Procedure Laterality Date   CAROTID STENT  2007     Medications:  Outpatient Encounter Medications as of 02/07/2021  Medication Sig    amLODipine (NORVASC) 10 MG tablet Take 1 tablet by mouth once daily   atorvastatin (LIPITOR) 80 MG tablet TAKE 1 TABLET BY MOUTH ONCE DAILY AT  6  PM   benazepril (LOTENSIN) 20 MG tablet Take 1 tablet by mouth twice daily   carbamazepine (TEGRETOL XR) 100 MG 12 hr tablet Take 1 tablet (100 mg total) by mouth 2 (two) times daily.   chlorthalidone (HYGROTON) 25 MG tablet Take 1 tablet (25 mg total) by mouth daily.   Cholecalciferol (VITAMIN D3) 50 MCG (2000 UT) TABS Take 1 tablet by mouth daily.   clopidogrel (PLAVIX) 75 MG tablet Take 1 tablet by mouth once daily   famotidine (PEPCID) 40 MG tablet Take 40 mg by mouth 2 (two) times daily.   Garlic 1200 MG CAPS Take 1 tablet by mouth daily.   metFORMIN (GLUCOPHAGE) 500 MG tablet Take 1 tablet (500 mg total) by mouth 2 (two) times daily with a meal.   metoprolol tartrate (LOPRESSOR) 50 MG tablet Take 1 tablet by mouth twice daily   pantoprazole (PROTONIX) 20 MG tablet Take 1 tablet (20 mg total) by mouth daily.   vitamin B-12 (CYANOCOBALAMIN) 500 MCG tablet Take 500 mcg by mouth daily.   vitamin C (ASCORBIC ACID) 500 MG tablet Take 500 mg by mouth daily.   No facility-administered encounter medications on file as of 02/07/2021.    Allergies:  Allergies  Allergen Reactions   Penicillins Rash    Has patient had a PCN reaction  causing immediate rash, facial/tongue/throat swelling, SOB or lightheadedness with hypotension: No Has patient had a PCN reaction causing severe rash involving mucus membranes or skin necrosis: No Has patient had a PCN reaction that required hospitalization: No Has patient had a PCN reaction occurring within the last 10 years: No If all of the above answers are "NO", then may proceed with Cephalosporin use.    Family History: Family History  Problem Relation Age of Onset   Diabetes Mother    Heart failure Mother    Other Father        WAR CASUALTY    Social History: Social History   Tobacco Use   Smoking  status: Former    Types: Cigarettes    Quit date: 09/02/1972    Years since quitting: 48.4   Smokeless tobacco: Never  Vaping Use   Vaping Use: Never used  Substance Use Topics   Alcohol use: Yes    Comment: RARELY   Drug use: No   Social History   Social History Narrative   Not on file    Vital Signs:  BP (!) 162/74   Pulse 69   Ht 5\' 7"  (1.702 m)   Wt 173 lb (78.5 kg)   SpO2 97%   BMI 27.10 kg/m    General Medical Exam:   General:  Well appearing, comfortable.   Skin:  Erythematous macular rash over the legs >> arms and torso; no blisters, no rash on the palms or soles of feet.  Neurological Exam: MENTAL STATUS including orientation to time, place, person, recent and remote memory, attention span and concentration, language, and fund of knowledge is normal.  Speech is not dysarthric.  CRANIAL NERVES: II:  No visual field defects.    III-IV-VI: Pupils equal round and reactive to light.  Normal conjugate, extra-ocular eye movements in all directions of gaze.  No nystagmus.  No ptosis.   V:  Normal facial sensation.    VII:  Normal facial symmetry and movements.   VIII:  Normal hearing and vestibular function.   IX-X:  Normal palatal movement.   XI:  Normal shoulder shrug and head rotation.   XII:  Normal tongue strength and range of motion, no deviation or fasciculation.  MOTOR:  Motor strength is 5/5 throughout. No atrophy, fasciculations or abnormal movements.  No pronator drift.   MSRs:  Right        Left                  brachioradialis 2+  2+  biceps 2+  2+  triceps 2+  2+  patellar 2+  2+  ankle jerk 2+  2+  Hoffman no  no  plantar response down  down   SENSORY:  Normal and symmetric perception of light touch, pinprick, vibration, and proprioception.    COORDINATION/GAIT: Normal finger-to- nose-finger.  Intact rapid alternating movements bilaterally.  Able to rise from a chair without using arms.  Gait narrow based and stable. Tandem and stressed gait  intact.    IMPRESSION: Right trigeminal neuralgia, responsive to carbamazepine 100mg  BID which reduced the intensity of pain, but not the frequency.  I am concerned about drug rash and therefore will stop carbamazepine.  Instead, I will start gabapentin 300mg  at bedtime x 3 days, then increase to 300mg  BID.  He will call in 2 weeks and determine further titration. Regarding his rash, I recommend starting benadryl 25mg  q6h prn and follow-up with PCP or urgent care.  Return to clinic  in 2 months.  Thank you for allowing me to participate in patient's care.  If I can answer any additional questions, I would be pleased to do so.    Sincerely,    Clois Treanor K. Allena Katz, DO

## 2021-02-11 ENCOUNTER — Other Ambulatory Visit: Payer: Self-pay | Admitting: Cardiology

## 2021-02-12 ENCOUNTER — Other Ambulatory Visit: Payer: Self-pay | Admitting: *Deleted

## 2021-02-12 MED ORDER — CLOPIDOGREL BISULFATE 75 MG PO TABS: 75.0000 mg | ORAL_TABLET | Freq: Every day | ORAL | 1 refills | Status: DC

## 2021-03-08 ENCOUNTER — Ambulatory Visit
Admission: RE | Admit: 2021-03-08 | Discharge: 2021-03-08 | Disposition: A | Discharge status: Home / Self Care | Payer: Medicare HMO | Source: Ambulatory Visit | Attending: Neurology | Admitting: Neurology

## 2021-03-08 ENCOUNTER — Other Ambulatory Visit: Payer: Self-pay

## 2021-03-08 DIAGNOSIS — G500 Trigeminal neuralgia: Secondary | ICD-10-CM

## 2021-03-08 MED ORDER — GADOBENATE DIMEGLUMINE 529 MG/ML IV SOLN
15.0000 mL | Freq: Once | INTRAVENOUS | Status: AC | PRN
  Administered 2021-03-08: 15 mL via INTRAVENOUS

## 2021-03-19 ENCOUNTER — Encounter: Payer: Self-pay | Admitting: Neurology

## 2021-03-24 ENCOUNTER — Other Ambulatory Visit: Payer: Self-pay

## 2021-03-24 ENCOUNTER — Ambulatory Visit (INDEPENDENT_AMBULATORY_CARE_PROVIDER_SITE_OTHER): Payer: Medicare (Managed Care) | Admitting: Neurology

## 2021-03-24 ENCOUNTER — Encounter: Payer: Self-pay | Admitting: Neurology

## 2021-03-24 VITALS — BP 134/71 | HR 62 | Ht 67.0 in | Wt 174.0 lb

## 2021-03-24 DIAGNOSIS — G500 Trigeminal neuralgia: Secondary | ICD-10-CM

## 2021-03-24 MED ORDER — GABAPENTIN 300 MG PO CAPS: ORAL_CAPSULE | ORAL | 3 refills | Status: DC

## 2021-03-24 NOTE — Progress Notes (Signed)
Follow-up Visit   Date: 03/24/21   James Hall MRN: [id number] DOB: 18-Dec-1945   Interim History: James Hall is a 75 y.o. right-handed Caucasian male with CAD, hypertension, and hyperlipidemia returning to the clinic for follow-up of right trigeminal neuralgia.  The patient was accompanied to the clinic by self.  History of present illness: Starting in June 2022, he began having spells of right facial pain, descrbied as shooting, needle-like pain, starting behind the right eye, which has transitioned to involve the right temple and radiates into the upper jaw/tetth.  Initially, he was afraid to brush his teeth, chew, or even allow water to hit that side of the face.   He was seen in the ER and started on carbamazepine 100mg  BID.  This has reduced the intensity of pain, but not eleiminated it.  He continues to have daily spells of pain. No vision changes or facial weakness.  He has noticed a rash over the legs over the past week which is new.   UPDATE 03/24/2021:  He is here for follow-up visit.  He was switched to gabapentin 300mg  BID due to rash associated with carbamazepine. He is having intermittent spells of pain occurring about once every 2-3 weeks.  His pain is triggered by sleeping on the right side. His rash has cleared up.    Medications:  Current Outpatient Medications on File Prior to Visit  Medication Sig Dispense Refill   amLODipine (NORVASC) 10 MG tablet Take 1 tablet by mouth once daily 90 tablet 3   atorvastatin (LIPITOR) 80 MG tablet TAKE 1 TABLET BY MOUTH ONCE DAILY AT  6  PM 90 tablet 3   benazepril (LOTENSIN) 20 MG tablet Take 1 tablet by mouth twice daily 180 tablet 3   chlorthalidone (HYGROTON) 25 MG tablet Take 1 tablet (25 mg total) by mouth daily. 90 tablet 3   Cholecalciferol (VITAMIN D3) 50 MCG (2000 UT) TABS Take 1 tablet by mouth daily.     clopidogrel (PLAVIX) 75 MG tablet Take 1 tablet (75 mg total) by mouth daily. Patient needs appointment for  any future refills. Please call office at [phone] to schedule appointment. 1st attempt. 30 tablet 1   famotidine (PEPCID) 40 MG tablet Take 40 mg by mouth 2 (two) times daily.     Garlic 1200 MG CAPS Take 1 tablet by mouth daily.     metFORMIN (GLUCOPHAGE) 500 MG tablet Take 1 tablet (500 mg total) by mouth 2 (two) times daily with a meal. 60 tablet 11   metoprolol tartrate (LOPRESSOR) 50 MG tablet Take 1 tablet (50 mg total) by mouth 2 (two) times daily. Please make overdue appt with Dr for future refills Thank you 1st attempt 60 tablet 0   pantoprazole (PROTONIX) 20 MG tablet Take 1 tablet (20 mg total) by mouth daily. 30 tablet 11   vitamin B-12 (CYANOCOBALAMIN) 500 MCG tablet Take 500 mcg by mouth daily.     vitamin C (ASCORBIC ACID) 500 MG tablet Take 500 mg by mouth daily.     No current facility-administered medications on file prior to visit.    Allergies:  Allergies  Allergen Reactions   Carbamazepine Rash   Penicillins Rash    Has patient had a PCN reaction causing immediate rash, facial/tongue/throat swelling, SOB or lightheadedness with hypotension: No Has patient had a PCN reaction causing severe rash involving mucus membranes or skin necrosis: No Has patient had a PCN reaction that required hospitalization: No Has patient had a PCN  reaction occurring within the last 10 years: No If all of the above answers are "NO", then may proceed with Cephalosporin use.    Vital Signs:  BP 134/71   Pulse 62   Ht 5\' 7"  (1.702 m)   Wt 174 lb (78.9 kg)   SpO2 98%   BMI 27.25 kg/m   Neurological Exam: MENTAL STATUS including orientation to time, place, person, recent and remote memory, attention span and concentration, language, and fund of knowledge is normal.  Speech is not dysarthric.  CRANIAL NERVES:  No visual field defects.  Pupils equal round and reactive to light.  Normal conjugate, extra-ocular eye movements in all directions of gaze.  No ptosis.  Face is  symmetric. Facial sensation is normal  MOTOR:  Motor strength is 5/5 in all extremities.  No atrophy, fasciculations or abnormal movements.  No pronator drift.    COORDINATION/GAIT:  Normal finger-to- nose-finger.   Gait narrow based and stable.   Data: n/a  IMPRESSION/PLAN: Right trigeminal neuralgia, responsive to carbamazepine however it was stopped due to drug rash.  - Fortunately, he has relief with gabapentin 300mg  twice daily which will be continued.   - For breakthrough pain, he may take an extra dose of gabapentin 300mg  as needed  Return to clinic in 6 months.    Thank you for allowing me to participate in patient's care.  If I can answer any additional questions, I would be pleased to do so.    Sincerely,    Medardo Hassing K. , DO

## 2021-03-24 NOTE — Patient Instructions (Signed)
You can continue to take gabapentin 300mg  twice daily.  On days of severe pain, you can take extra 300mg  tablet.  Return to clinic in 6 months

## 2021-03-26 ENCOUNTER — Other Ambulatory Visit: Payer: Self-pay | Admitting: Cardiology

## 2021-04-09 ENCOUNTER — Ambulatory Visit: Payer: Medicare HMO | Admitting: Neurology

## 2021-04-11 ENCOUNTER — Other Ambulatory Visit: Payer: Self-pay | Admitting: *Deleted

## 2021-04-11 MED ORDER — CLOPIDOGREL BISULFATE 75 MG PO TABS: 75.0000 mg | ORAL_TABLET | Freq: Every day | ORAL | 11 refills | Status: DC

## 2021-07-02 ENCOUNTER — Ambulatory Visit: Payer: Medicare PPO | Admitting: Cardiology

## 2021-07-02 ENCOUNTER — Encounter: Payer: Self-pay | Admitting: Cardiology

## 2021-07-02 ENCOUNTER — Other Ambulatory Visit: Payer: Self-pay

## 2021-07-02 VITALS — BP 130/70 | HR 57 | Ht 67.0 in | Wt 185.0 lb

## 2021-07-02 DIAGNOSIS — E7800 Pure hypercholesterolemia, unspecified: Secondary | ICD-10-CM | POA: Diagnosis not present

## 2021-07-02 DIAGNOSIS — I2510 Atherosclerotic heart disease of native coronary artery without angina pectoris: Secondary | ICD-10-CM | POA: Diagnosis not present

## 2021-07-02 DIAGNOSIS — I10 Essential (primary) hypertension: Secondary | ICD-10-CM | POA: Diagnosis not present

## 2021-07-02 DIAGNOSIS — E782 Mixed hyperlipidemia: Secondary | ICD-10-CM | POA: Diagnosis not present

## 2021-07-02 LAB — COMPREHENSIVE METABOLIC PANEL
ALT: 22 IU/L (ref 0–44)
AST: 16 IU/L (ref 0–40)
Albumin/Globulin Ratio: 2.1 (ref 1.2–2.2)
Albumin: 4.6 g/dL (ref 3.7–4.7)
Alkaline Phosphatase: 77 IU/L (ref 44–121)
BUN/Creatinine Ratio: 14 (ref 10–24)
BUN: 11 mg/dL (ref 8–27)
Bilirubin Total: 0.3 mg/dL (ref 0.0–1.2)
CO2: 25 mmol/L (ref 20–29)
Calcium: 9.9 mg/dL (ref 8.6–10.2)
Chloride: 101 mmol/L (ref 96–106)
Creatinine, Ser: 0.79 mg/dL (ref 0.76–1.27)
Globulin, Total: 2.2 g/dL (ref 1.5–4.5)
Glucose: 139 mg/dL — ABNORMAL HIGH (ref 70–99)
Potassium: 4.4 mmol/L (ref 3.5–5.2)
Sodium: 138 mmol/L (ref 134–144)
Total Protein: 6.8 g/dL (ref 6.0–8.5)
eGFR: 91 mL/min/{1.73_m2} (ref >59)

## 2021-07-02 LAB — CBC
Hematocrit: 43.1 % (ref 37.5–51.0)
Hemoglobin: 14.5 g/dL (ref 13.0–17.7)
MCH: 28.9 pg (ref 26.6–33.0)
MCHC: 33.6 g/dL (ref 31.5–35.7)
MCV: 86 fL (ref 79–97)
Platelets: 322 10*3/uL (ref 150–450)
RBC: 5.02 x10E6/uL (ref 4.14–5.80)
RDW: 12.4 % (ref 11.6–15.4)
WBC: 8.6 10*3/uL (ref 3.4–10.8)

## 2021-07-02 LAB — LIPID PANEL
Chol/HDL Ratio: 4.2 ratio (ref 0.0–5.0)
Cholesterol, Total: 163 mg/dL (ref 100–199)
HDL: 39 mg/dL — ABNORMAL LOW (ref >39)
LDL Chol Calc (NIH): 73 mg/dL (ref 0–99)
Triglycerides: 317 mg/dL — ABNORMAL HIGH (ref 0–149)
VLDL Cholesterol Cal: 51 mg/dL — ABNORMAL HIGH (ref 5–40)

## 2021-07-02 NOTE — Progress Notes (Signed)
With Cardiology Office Note:    Date:  07/02/2021   ID:  James Hall, DOB [birth year], MRN [id number]  PCP:  Marisa Cyphers, FNP   [facility] Providers Cardiologist:  Donato Schultz, MD     Referring MD: Marisa Cyphers, FNP    History of Present Illness:    James Hall is a 76 y.o. male here for the follow-up of coronary artery disease prior stent placed in 2007 hypertension hyperlipidemia.  He quit smoking in 1974.  Cypher DES.  Has had prior hypertensive emergency in 2018.  Overall been doing quite well.  No chest pain fevers chills nausea vomiting syncope bleeding.  He takes an antidiarrheal pill daily.  I do not see a recent lipid panel.  Past Medical History:  Diagnosis Date   Atherosclerotic heart disease of native coronary artery without angina pectoris    Coronary arteriosclerosis due to lipid rich plaque    Hypertension    Mixed hyperlipidemia     Past Surgical History:  Procedure Laterality Date   CAROTID STENT  2007    Current Medications: Current Meds  Medication Sig   amLODipine (NORVASC) 10 MG tablet Take 1 tablet by mouth once daily   atorvastatin (LIPITOR) 80 MG tablet TAKE 1 TABLET BY MOUTH ONCE DAILY AT  6  PM   benazepril (LOTENSIN) 20 MG tablet Take 1 tablet by mouth twice daily   chlorthalidone (HYGROTON) 25 MG tablet Take 1 tablet (25 mg total) by mouth daily.   Cholecalciferol (VITAMIN D3) 50 MCG (2000 UT) TABS Take 1 tablet by mouth daily.   clopidogrel (PLAVIX) 75 MG tablet Take 1 tablet (75 mg total) by mouth daily. Patient needs appointment for any future refills. Please call office at [phone] to schedule appointment. 1st attempt.   famotidine (PEPCID) 40 MG tablet Take 40 mg by mouth 2 (two) times daily.   gabapentin (NEURONTIN) 300 MG capsule Take 1 tablet twice daily.  On days of severe pain, you may take an extra 300mg  tablet.   Garlic 1200 MG CAPS Take 1 tablet by mouth daily.   metFORMIN (GLUCOPHAGE) 500 MG tablet  Take 1 tablet (500 mg total) by mouth 2 (two) times daily with a meal.   metoprolol tartrate (LOPRESSOR) 50 MG tablet Take 1 tablet (50 mg total) by mouth 2 (two) times daily. Please keep upcoming appt in January 2023 with Dr. February 2023 before anymore refills. Thank you Final Attempt   pantoprazole (PROTONIX) 20 MG tablet Take 1 tablet (20 mg total) by mouth daily.   vitamin B-12 (CYANOCOBALAMIN) 500 MCG tablet Take 500 mcg by mouth daily.   vitamin C (ASCORBIC ACID) 500 MG tablet Take 500 mg by mouth daily.     Allergies:   Carbamazepine and Penicillins   Social History   Socioeconomic History   Marital status: Married    Spouse name: Not on file   Number of children: 2   Years of education: Not on file   Highest education level: Not on file  Occupational History   Not on file  Tobacco Use   Smoking status: Former    Types: Cigarettes    Quit date: 09/02/1972    Years since quitting: 48.8   Smokeless tobacco: Never  Vaping Use   Vaping Use: Never used  Substance and Sexual Activity   Alcohol use: Yes    Comment: RARELY   Drug use: No   Sexual activity: Not on file  Other Topics Concern   Not on file  Social History Narrative   Right handed   Lives in a one story home   Social Determinants of Health   Financial Resource Strain: Not on file  Food Insecurity: Not on file  Transportation Needs: Not on file  Physical Activity: Not on file  Stress: Not on file  Social Connections: Not on file     Family History: The patient's family history includes Diabetes in his mother; Heart failure in his mother; Other in his father.  ROS:   Please see the history of present illness.     All other systems reviewed and are negative.  EKGs/Labs/Other Studies Reviewed:    The following studies were reviewed today: Prior office note  EKG:  EKG is  ordered today.  The ekg ordered today demonstrates sinus bradycardia 57 no other abnormalities  Recent Labs: No results found for  requested labs within last 8760 hours.  Recent Lipid Panel    Component Value Date/Time   CHOL 152 05/30/2018 0952   TRIG 178 (H) 05/30/2018 0952   HDL 46 05/30/2018 0952   CHOLHDL 3.3 05/30/2018 0952   CHOLHDL 5.6 02/26/2017 1156   VLDL 44 (H) 02/26/2017 1156   LDLCALC 70 05/30/2018 0952     Risk Assessment/Calculations:              Physical Exam:    VS:  BP 130/70 (BP Location: Left Arm, Patient Position: Sitting, Cuff Size: Normal)    Pulse (!) 57    Ht 5\' 7"  (1.702 m)    Wt 185 lb (83.9 kg)    SpO2 94%    BMI 28.98 kg/m     Wt Readings from Last 3 Encounters:  07/02/21 185 lb (83.9 kg)  03/24/21 174 lb (78.9 kg)  02/07/21 173 lb (78.5 kg)     GEN:  Well nourished, well developed in no acute distress HEENT: Normal NECK: No JVD; No carotid bruits LYMPHATICS: No lymphadenopathy CARDIAC: RRR, no murmurs, no rubs, gallops RESPIRATORY:  Clear to auscultation without rales, wheezing or rhonchi  ABDOMEN: Soft, non-tender, non-distended MUSCULOSKELETAL:  No edema; No deformity  SKIN: Warm and dry NEUROLOGIC:  Alert and oriented x 3 PSYCHIATRIC:  Normal affect   ASSESSMENT:    1. Coronary artery disease involving native coronary artery of native heart without angina pectoris   2. Essential hypertension   3. Pure hypercholesterolemia   4. Mixed hyperlipidemia    PLAN:    In order of problems listed above:  Coronary artery disease involving native coronary artery of native heart without angina pectoris Cypher stent 2007.  Been on Plavix since.  This is good because of potential late stent thrombosis with these early generation DES.  Dr. 2008 in [place] performed the procedure.  Continue with Plavix monotherapy.  No bleeding.  We will check a CBC.  Continue to encourage diet and exercise.  Essential hypertension Currently very well controlled on medication management as above.  No changes made today.  Medications reviewed and refilled as needed.  Mixed  hyperlipidemia Checking lipid panel.  LDL goal less than 70.  In 2019 it was 70.  No changes made in statin therapy currently.            Medication Adjustments/Labs and Tests Ordered: Current medicines are reviewed at length with the patient today.  Concerns regarding medicines are outlined above.  Orders Placed This Encounter  Procedures   Comprehensive metabolic panel   CBC   Lipid panel   EKG 12-Lead   No  orders of the defined types were placed in this encounter.   Patient Instructions  Medication Instructions:  Your physician recommends that you continue on your current medications as directed. Please refer to the Current Medication list given to you today.  *If you need a refill on your cardiac medications before your next appointment, please call your pharmacy*   Lab Work: TODAY: CBC, CMET, FLP If you have labs (blood work) drawn today and your tests are completely normal, you will receive your results only by: MyChart Message (if you have MyChart) OR A paper copy in the mail If you have any lab test that is abnormal or we need to change your treatment, we will call you to review the results.   Testing/Procedures: NONE   Follow-Up: At [facility], you and your health needs are our priority.  As part of our continuing mission to provide you with exceptional heart care, we have created designated Provider Care Teams.  These Care Teams include your primary Cardiologist (physician) and Advanced Practice Providers (APPs -  Physician Assistants and Nurse Practitioners) who all work together to provide you with the care you need, when you need it.  We recommend signing up for the patient portal called "MyChart".  Sign up information is provided on this After Visit Summary.  MyChart is used to connect with patients for Virtual Visits (Telemedicine).  Patients are able to view lab/test results, encounter notes, upcoming appointments, etc.  Non-urgent messages can be sent  to your provider as well.   To learn more about what you can do with MyChart, go to [email or web address].    Your next appointment:   1 year(s)  The format for your next appointment:   In Person  Provider:   Donato SchultzMark Vermon Grays, MD       Signed, Donato SchultzMark Minela Bridgewater, MD  07/02/2021 11:45 AM    [facility]

## 2021-07-02 NOTE — Assessment & Plan Note (Signed)
Cypher stent 2007.  Been on Plavix since.  This is good because of potential late stent thrombosis with these early generation DES.  Dr. Bobby Rumpf in [place] performed the procedure.  Continue with Plavix monotherapy.  No bleeding.  We will check a CBC.  Continue to encourage diet and exercise.

## 2021-07-02 NOTE — Assessment & Plan Note (Signed)
Currently very well controlled on medication management as above.  No changes made today.  Medications reviewed and refilled as needed.

## 2021-07-02 NOTE — Patient Instructions (Signed)
Medication Instructions:  Your physician recommends that you continue on your current medications as directed. Please refer to the Current Medication list given to you today.  *If you need a refill on your cardiac medications before your next appointment, please call your pharmacy*   Lab Work: TODAY: CBC, CMET, FLP If you have labs (blood work) drawn today and your tests are completely normal, you will receive your results only by: MyChart Message (if you have MyChart) OR A paper copy in the mail If you have any lab test that is abnormal or we need to change your treatment, we will call you to review the results.   Testing/Procedures: NONE   Follow-Up: At [facility], you and your health needs are our priority.  As part of our continuing mission to provide you with exceptional heart care, we have created designated Provider Care Teams.  These Care Teams include your primary Cardiologist (physician) and Advanced Practice Providers (APPs -  Physician Assistants and Nurse Practitioners) who all work together to provide you with the care you need, when you need it.  We recommend signing up for the patient portal called "MyChart".  Sign up information is provided on this After Visit Summary.  MyChart is used to connect with patients for Virtual Visits (Telemedicine).  Patients are able to view lab/test results, encounter notes, upcoming appointments, etc.  Non-urgent messages can be sent to your provider as well.   To learn more about what you can do with MyChart, go to [email or web address].    Your next appointment:   1 year(s)  The format for your next appointment:   In Person  Provider:   Donato Schultz, MD

## 2021-07-02 NOTE — Assessment & Plan Note (Signed)
Checking lipid panel.  LDL goal less than 70.  In 2019 it was 70.  No changes made in statin therapy currently.

## 2021-07-03 ENCOUNTER — Encounter: Payer: Self-pay | Admitting: *Deleted

## 2021-07-12 ENCOUNTER — Other Ambulatory Visit: Payer: Self-pay | Admitting: Cardiology

## 2021-07-14 ENCOUNTER — Other Ambulatory Visit: Payer: Self-pay | Admitting: *Deleted

## 2021-07-14 MED ORDER — AMLODIPINE BESYLATE 10 MG PO TABS: 10.0000 mg | ORAL_TABLET | Freq: Every day | ORAL | 3 refills | Status: DC

## 2021-07-21 ENCOUNTER — Other Ambulatory Visit: Payer: Self-pay | Admitting: *Deleted

## 2021-07-21 MED ORDER — BENAZEPRIL HCL 20 MG PO TABS: 20.0000 mg | ORAL_TABLET | Freq: Two times a day (BID) | ORAL | 3 refills | Status: DC

## 2021-08-11 ENCOUNTER — Other Ambulatory Visit: Payer: Self-pay | Admitting: *Deleted

## 2021-08-11 MED ORDER — ATORVASTATIN CALCIUM 80 MG PO TABS: ORAL_TABLET | ORAL | 3 refills | Status: DC

## 2021-08-24 ENCOUNTER — Other Ambulatory Visit: Payer: Self-pay | Admitting: Neurology

## 2021-08-25 NOTE — Telephone Encounter (Signed)
Enough given until follow up with Dr.Patel, no further refills until seen ?

## 2021-09-09 ENCOUNTER — Other Ambulatory Visit: Payer: Self-pay | Admitting: Neurology

## 2021-09-15 ENCOUNTER — Other Ambulatory Visit: Payer: Self-pay | Admitting: Neurology

## 2021-09-22 ENCOUNTER — Ambulatory Visit (INDEPENDENT_AMBULATORY_CARE_PROVIDER_SITE_OTHER): Payer: Medicare PPO | Admitting: Neurology

## 2021-09-22 ENCOUNTER — Encounter: Payer: Self-pay | Admitting: Neurology

## 2021-09-22 ENCOUNTER — Other Ambulatory Visit: Payer: Self-pay

## 2021-09-22 VITALS — BP 165/78 | HR 61 | Ht 67.0 in | Wt 188.0 lb

## 2021-09-22 DIAGNOSIS — G500 Trigeminal neuralgia: Secondary | ICD-10-CM | POA: Diagnosis not present

## 2021-09-22 MED ORDER — GABAPENTIN 300 MG PO CAPS
ORAL_CAPSULE | ORAL | 3 refills | Status: DC
Start: 2021-09-22 — End: 2021-09-22

## 2021-09-22 MED ORDER — GABAPENTIN 300 MG PO CAPS: ORAL_CAPSULE | ORAL | 3 refills | Status: DC

## 2021-09-22 NOTE — Addendum Note (Signed)
Addended by: Armen Pickup A on: 09/22/2021 03:02 PM ? ? Modules accepted: Orders ? ?

## 2021-09-22 NOTE — Progress Notes (Signed)
? ? ?Follow-up Visit ? ? ?Date: 09/22/21 ? ? ?James Hall ?MRN: [id number] ?DOB: 02-Dec-1945 ? ? ?Interim History: ?James Hall is a 76 y.o. right-handed Caucasian male with CAD, hypertension, and hyperlipidemia returning to the clinic for follow-up of right trigeminal neuralgia.  The patient was accompanied to the clinic by self. ? ?History of present illness: ?Starting in June 2022, he began having spells of right facial pain, descrbied as shooting, needle-like pain, starting behind the right eye, which has transitioned to involve the right temple and radiates into the upper jaw/tetth.  Initially, he was afraid to brush his teeth, chew, or even allow water to hit that side of the face.   He was seen in the ER and started on carbamazepine 100mg  BID.  This has reduced the intensity of pain, but not eleiminated it.  He continues to have daily spells of pain. No vision changes or facial weakness.  He has noticed a rash over the legs over the past week which is new.  ? ?UPDATE 03/24/2021:  He is here for follow-up visit.  He was switched to gabapentin 300mg  BID due to rash associated with carbamazepine. He is having intermittent spells of pain occurring about once every 2-3 weeks.  His pain is triggered by sleeping on the right side. His rash has cleared up.   ? ?UPDATE 09/22/2021:  He is here for follow-up visit.  He has one spell of right facial pain in February which was very brief and self limiting.  He remains on gabapentin 300mg  twice daily.  He has not needed to take an extra dose.  No new neurological complaints.  ? ?Medications:  ?Current Outpatient Medications on File Prior to Visit  ?Medication Sig Dispense Refill  ? amLODipine (NORVASC) 10 MG tablet Take 1 tablet (10 mg total) by mouth daily. 90 tablet 3  ? atorvastatin (LIPITOR) 80 MG tablet TAKE 1 TABLET BY MOUTH ONCE DAILY AT  6  PM 90 tablet 3  ? benazepril (LOTENSIN) 20 MG tablet Take 1 tablet (20 mg total) by mouth 2 (two) times daily. 180  tablet 3  ? chlorthalidone (HYGROTON) 25 MG tablet Take 1 tablet (25 mg total) by mouth daily. 90 tablet 3  ? Cholecalciferol (VITAMIN D3) 50 MCG (2000 UT) TABS Take 1 tablet by mouth daily.    ? clopidogrel (PLAVIX) 75 MG tablet Take 1 tablet (75 mg total) by mouth daily. Patient needs appointment for any future refills. Please call office at [phone] to schedule appointment. 1st attempt. 30 tablet 11  ? famotidine (PEPCID) 40 MG tablet Take 40 mg by mouth 2 (two) times daily.    ? gabapentin (NEURONTIN) 300 MG capsule TAKE 1 CAPSULE BY MOUTH ONCE DAILY AT BEDTIME FOR 3 DAYS, THEN INCREASE TO 1 CAPSULE TWICE DAILY 58 capsule 0  ? Garlic 1200 MG CAPS Take 1 tablet by mouth daily.    ? metFORMIN (GLUCOPHAGE) 500 MG tablet Take 1 tablet (500 mg total) by mouth 2 (two) times daily with a meal. 60 tablet 11  ? metoprolol tartrate (LOPRESSOR) 50 MG tablet Take 1 tablet (50 mg total) by mouth 2 (two) times daily. 180 tablet 3  ? pantoprazole (PROTONIX) 20 MG tablet Take 1 tablet (20 mg total) by mouth daily. 30 tablet 11  ? vitamin B-12 (CYANOCOBALAMIN) 500 MCG tablet Take 500 mcg by mouth daily.    ? vitamin C (ASCORBIC ACID) 500 MG tablet Take 500 mg by mouth daily.    ? ?No current facility-administered  medications on file prior to visit.  ? ? ?Allergies:  ?Allergies  ?Allergen Reactions  ? Carbamazepine Rash  ? Penicillins Rash  ?  Has patient had a PCN reaction causing immediate rash, facial/tongue/throat swelling, SOB or lightheadedness with hypotension: No ?Has patient had a PCN reaction causing severe rash involving mucus membranes or skin necrosis: No ?Has patient had a PCN reaction that required hospitalization: No ?Has patient had a PCN reaction occurring within the last 10 years: No ?If all of the above answers are "NO", then may proceed with Cephalosporin use.  ? ? ?Vital Signs:  ?BP (!) 165/78   Pulse 61   Ht 5\' 7"  (1.702 m)   Wt 188 lb (85.3 kg)   SpO2 96%   BMI 29.44 kg/m?  ? ?Neurological  Exam: ?MENTAL STATUS including orientation to time, place, person, recent and remote memory, attention span and concentration, language, and fund of knowledge is normal.  Speech is not dysarthric. ? ?CRANIAL NERVES:   Pupils equal round and reactive to light.  Normal conjugate, extra-ocular eye movements in all directions of gaze.  No ptosis.  Face is symmetric. Facial sensation is normal ? ?MOTOR:  Motor strength is 5/5 in all extremities.  No atrophy, fasciculations or abnormal movements.  No pronator drift.   ? ?COORDINATION/GAIT:  Normal finger-to- nose-finger.   Gait narrow based and stable.  ? ?Data: n/a ? ?IMPRESSION/PLAN: ?Right trigeminal neuralgia, stable and well-controlled ? - Previously tried carbamazepine (effective, stopped due to rash) ? - Doing well on gabapentin 300mg  BID, which was refilled.  OK to take extra dose as needed for breakthrough pain ? ?Return to clinic in 1 year ? ? ?Thank you for allowing me to participate in patient's care.  If I can answer any additional questions, I would be pleased to do so.   ? ?Sincerely, ? ? ? ?Karmina Zufall K. , DO ? ? ?

## 2021-09-22 NOTE — Patient Instructions (Signed)
Continue gabapentin 300mg twice daily  Return to clinic in 1 year 

## 2022-04-01 ENCOUNTER — Other Ambulatory Visit: Payer: Self-pay | Admitting: Neurology

## 2022-04-13 ENCOUNTER — Encounter: Payer: Self-pay | Admitting: Neurology

## 2022-04-18 ENCOUNTER — Other Ambulatory Visit: Payer: Self-pay | Admitting: Cardiology

## 2022-04-29 ENCOUNTER — Other Ambulatory Visit: Payer: Self-pay | Admitting: Cardiology

## 2022-05-02 ENCOUNTER — Other Ambulatory Visit: Payer: Self-pay | Admitting: Cardiology

## 2022-07-16 ENCOUNTER — Other Ambulatory Visit: Payer: Self-pay | Admitting: Neurology

## 2022-08-26 ENCOUNTER — Other Ambulatory Visit: Payer: Self-pay | Admitting: Cardiology

## 2022-08-27 ENCOUNTER — Other Ambulatory Visit: Payer: Self-pay

## 2022-08-27 MED ORDER — ATORVASTATIN CALCIUM 80 MG PO TABS: ORAL_TABLET | ORAL | 0 refills | Status: DC

## 2022-08-28 MED ORDER — ATORVASTATIN CALCIUM 80 MG PO TABS: ORAL_TABLET | ORAL | 0 refills | Status: DC

## 2022-08-28 NOTE — Addendum Note (Signed)
Addended by: Laure Kidney A on: 08/28/2022 09:56 AM   Modules accepted: Orders

## 2022-09-22 ENCOUNTER — Ambulatory Visit: Payer: Medicare Other | Admitting: Neurology

## 2022-09-22 ENCOUNTER — Encounter: Payer: Self-pay | Admitting: Neurology

## 2022-09-22 VITALS — BP 130/73 | HR 58 | Ht 67.0 in | Wt 187.0 lb

## 2022-09-22 DIAGNOSIS — G500 Trigeminal neuralgia: Secondary | ICD-10-CM | POA: Diagnosis not present

## 2022-09-22 MED ORDER — GABAPENTIN 300 MG PO CAPS: ORAL_CAPSULE | ORAL | 3 refills | Status: DC

## 2022-09-22 NOTE — Progress Notes (Signed)
Follow-up Visit   Date: 09/22/22   Linkyn Hendler MRN: [id number] DOB: 09-20-1945   Interim History: Adolfo Braband is a 77 y.o. right-handed Caucasian male with CAD, hypertension, and hyperlipidemia returning to the clinic for follow-up of right trigeminal neuralgia.  The patient was accompanied to the clinic by self.  IMPRESSION/PLAN: Right trigeminal neuralgia (2022), mild worsening with intermittent daily pain.  Offered to increase gabapentin to 300mg  TID, however he prefers to stay on BID.   - Previously tried carbamazepine (effective, stopped due to rash)  - Continue gabapentin 300mg  BID.  OK to take extra dose as needed  Return to clinic in 1 year  ---------------------------------------------------------------- History of present illness: Starting in June 2022, he began having spells of right facial pain, descrbied as shooting, needle-like pain, starting behind the right eye, which has transitioned to involve the right temple and radiates into the upper jaw/tetth.  Initially, he was afraid to brush his teeth, chew, or even allow water to hit that side of the face.   He was seen in the ER and started on carbamazepine 100mg  BID.  This has reduced the intensity of pain, but not eleiminated it.  He continues to have daily spells of pain. No vision changes or facial weakness.  He has noticed a rash over the legs over the past week which is new.   UPDATE 03/24/2021:  He is here for follow-up visit.  He was switched to gabapentin 300mg  BID due to rash associated with carbamazepine. He is having intermittent spells of pain occurring about once every 2-3 weeks.  His pain is triggered by sleeping on the right side. His rash has cleared up.    UPDATE 09/22/2022:  He is here for 1 year follow-up.  His trigeminal neuralgia was under excellent control until around January/February, when he started having spells of pain again. Pain is very brief lasting only a few second, but does occur  daily.  He says that the pain is not as severe as it previously was and he does not want to increase the dose of medications.  Pain does not interfere with chewing, eating, or talking.   Medications:  Current Outpatient Medications on File Prior to Visit  Medication Sig Dispense Refill   amLODipine (NORVASC) 10 MG tablet Take 1 tablet by mouth once daily 90 tablet 0   atorvastatin (LIPITOR) 80 MG tablet TAKE 1 TABLET BY MOUTH ONCE DAILY AT  6PM 30 tablet 0   benazepril (LOTENSIN) 20 MG tablet Take 1 tablet (20 mg total) by mouth 2 (two) times daily. Please call our office at [phone] to schedule an appt with Dr. Anne Fu for future refills. Thank you 1st attempt. 60 tablet 0   chlorthalidone (HYGROTON) 25 MG tablet Take 1 tablet (25 mg total) by mouth daily. 90 tablet 3   Cholecalciferol (VITAMIN D3) 50 MCG (2000 UT) TABS Take 1 tablet by mouth daily.     clopidogrel (PLAVIX) 75 MG tablet Take 1 tablet (75 mg total) by mouth daily. Patient needs appointment for any future refills. Please call office at [phone] to schedule appointment. 1st attempt. 30 tablet 11   famotidine (PEPCID) 40 MG tablet Take 40 mg by mouth 2 (two) times daily.     gabapentin (NEURONTIN) 300 MG capsule TAKE 1 CAPSULE BY MOUTH TWICE DAILY (OK  TO  TAKE  AN  EXTRA  DOSE  AS  NEEDED) 200 capsule 0   Garlic 1200 MG CAPS Take 1 tablet by mouth daily.  metFORMIN (GLUCOPHAGE) 500 MG tablet Take 1 tablet (500 mg total) by mouth 2 (two) times daily with a meal. 60 tablet 11   metoprolol tartrate (LOPRESSOR) 50 MG tablet Take 1 tablet by mouth twice daily 180 tablet 0   pantoprazole (PROTONIX) 20 MG tablet Take 1 tablet (20 mg total) by mouth daily. 30 tablet 11   vitamin B-12 (CYANOCOBALAMIN) 500 MCG tablet Take 500 mcg by mouth daily.     vitamin C (ASCORBIC ACID) 500 MG tablet Take 500 mg by mouth daily.     No current facility-administered medications on file prior to visit.    Allergies:  Allergies  Allergen  Reactions   Carbamazepine Rash   Penicillins Rash    Has patient had a PCN reaction causing immediate rash, facial/tongue/throat swelling, SOB or lightheadedness with hypotension: No Has patient had a PCN reaction causing severe rash involving mucus membranes or skin necrosis: No Has patient had a PCN reaction that required hospitalization: No Has patient had a PCN reaction occurring within the last 10 years: No If all of the above answers are "NO", then may proceed with Cephalosporin use.    Vital Signs:  BP 130/73   Pulse (!) 58   Ht 5\' 7"  (1.702 m)   Wt 187 lb (84.8 kg)   SpO2 97%   BMI 29.29 kg/m   Neurological Exam: MENTAL STATUS including orientation to time, place, person, recent and remote memory, attention span and concentration, language, and fund of knowledge is normal.  Speech is not dysarthric.  CRANIAL NERVES:   Pupils equal round and reactive to light.  Normal conjugate, extra-ocular eye movements in all directions of gaze.  No ptosis.  Face is symmetric. Facial sensation is normal  MOTOR:  Motor strength is 5/5 in all extremities.  No atrophy, fasciculations or abnormal movements.  No pronator drift.    COORDINATION/GAIT:  Normal finger-to- nose-finger.   Gait narrow based and stable.   Data: n/a    Thank you for allowing me to participate in patient's care.  If I can answer any additional questions, I would be pleased to do so.    Sincerely,    Arda Keadle K. Allena Katz, DO

## 2022-09-22 NOTE — Patient Instructions (Signed)
Continue gabapentin 300mg  twice daily.  You my take an extra dose as needed for severe pain  Return to clinic in 1 year

## 2022-09-24 ENCOUNTER — Other Ambulatory Visit: Payer: Self-pay | Admitting: Cardiology

## 2022-09-28 ENCOUNTER — Ambulatory Visit: Payer: Medicare PPO | Admitting: Neurology

## 2022-10-08 ENCOUNTER — Other Ambulatory Visit: Payer: Self-pay | Admitting: Cardiology

## 2022-10-16 ENCOUNTER — Encounter: Payer: Self-pay | Admitting: Physician Assistant

## 2022-10-16 ENCOUNTER — Ambulatory Visit: Payer: Medicare Other | Attending: Physician Assistant | Admitting: Physician Assistant

## 2022-10-16 VITALS — BP 155/78 | HR 74 | Ht 67.0 in | Wt 183.0 lb

## 2022-10-16 DIAGNOSIS — I10 Essential (primary) hypertension: Secondary | ICD-10-CM

## 2022-10-16 DIAGNOSIS — I639 Cerebral infarction, unspecified: Secondary | ICD-10-CM

## 2022-10-16 DIAGNOSIS — I2510 Atherosclerotic heart disease of native coronary artery without angina pectoris: Secondary | ICD-10-CM

## 2022-10-16 DIAGNOSIS — E785 Hyperlipidemia, unspecified: Secondary | ICD-10-CM

## 2022-10-16 NOTE — Progress Notes (Signed)
Office Visit    Patient Name: James Hall Date of Encounter: 10/16/2022  PCP:  Marisa Cyphers, FNP   [facility]  Cardiologist:  Donato Schultz, MD  Advanced Practice Provider:  No care team member to display Electrophysiologist:  None   HPI    James Hall is a 77 y.o. male with a past medical history of coronary artery disease with prior stenting placed in 2007, hypertension, hyperlipidemia, smoking cessation back in 1974 presents today for overdue follow-up appointment.  History of prior hypertensive emergency in 2018.  Last seen 07/02/2021 and was overall doing well.  No chest pain, fevers, chills, nausea, vomiting, syncope, or bleeding.  He was taking an antidiarrheal pill daily.  No recent lipid panel at that time.  Today, he is doing well from a cardiovascular standpoint.  Blood pressure is slightly elevated.  We have asked him to monitor this at home.  He is compliant with all medications.  He is taking for blood pressure pills.  He tells me that he had a stroke 5 years ago and did not do any PT/OT/ST.  He has had fatigue and decrease in stamina since that time.  Reports no shortness of breath nor dyspnea on exertion. Reports no chest pain, pressure, or tightness. No edema, orthopnea, PND. Reports no palpitations.   Past Medical History    Past Medical History:  Diagnosis Date   Atherosclerotic heart disease of native coronary artery without angina pectoris    Coronary arteriosclerosis due to lipid rich plaque    Hypertension    Mixed hyperlipidemia    Past Surgical History:  Procedure Laterality Date   Coronary  STENT  2007    Allergies  Allergies  Allergen Reactions   Carbamazepine Rash   Penicillins Rash    Has patient had a PCN reaction causing immediate rash, facial/tongue/throat swelling, SOB or lightheadedness with hypotension: No Has patient had a PCN reaction causing severe rash involving mucus membranes or skin necrosis:  No Has patient had a PCN reaction that required hospitalization: No Has patient had a PCN reaction occurring within the last 10 years: No If all of the above answers are "NO", then may proceed with Cephalosporin use.    EKGs/Labs/Other Studies Reviewed:   The following studies were reviewed today: Cardiac Studies & Procedures     STRESS TESTS  MYOCARDIAL PERFUSION IMAGING 10/30/2016   ECHOCARDIOGRAM  ECHOCARDIOGRAM COMPLETE 02/26/2017  Narrative *[facility]* *[facility]* [address] [place], [place] [address] [phone]  ------------------------------------------------------------------- Transthoracic Echocardiography  Patient:    James Hall MR #:       [id number] Study Date: 02/26/2017 Gender:     M Age:        7 Height:     170.2 cm Weight:     85.9 kg BSA:        2.04 m^2 Pt. Status: Room:       2M12C  ATTENDING    Billee Cashing. PERFORMING   [facility], Inpatient ORDERING     Duayne Cal ADMITTING    Mannam, Praveen SONOGRAPHER  Sinda Du, RDCS  cc:  ------------------------------------------------------------------- LV EF: 60% -   65%  ------------------------------------------------------------------- History:   PMH:  hypertensive crisis.  Coronary artery disease. Risk factors:  Hypertension. Dyslipidemia.  ------------------------------------------------------------------- Study Conclusions  - Left ventricle: The cavity size was normal. There was mild concentric hypertrophy. Systolic function was normal. The estimated ejection fraction was in the range of 60% to 65%.  Wall motion was normal; there were no regional wall motion abnormalities. Doppler parameters are consistent with abnormal left ventricular relaxation (grade 1 diastolic dysfunction). Doppler parameters are consistent with indeterminate ventricular filling pressure. - Aortic valve: Transvalvular velocity was within the normal range. There was no  stenosis. There was no regurgitation. - Mitral valve: Transvalvular velocity was within the normal range. There was no evidence for stenosis. There was no regurgitation. - Right ventricle: The cavity size was normal. Wall thickness was normal. Systolic function was normal. - Atrial septum: No defect or patent foramen ovale was identified. - Tricuspid valve: There was no regurgitation.  ------------------------------------------------------------------- Study data:  Comparison was made to the study of 12/21/2015.  Study status:  Routine.  Procedure:  Transthoracic echocardiography. Image quality was adequate.  Study completion:  There were no complications.          Transthoracic echocardiography.  M-mode, complete 2D, spectral Doppler, and color Doppler.  Birthdate: Patient birthdate: 07/13/1945.  Age:  Patient is 77 yr old.  Sex: Gender: male.    BMI: 29.7 kg/m^2.  Blood pressure:     166/72 Patient status:  Inpatient.  Study date:  Study date: 02/26/2017. Study time: 07:50 AM.  Location:  Bedside.  -------------------------------------------------------------------  ------------------------------------------------------------------- Left ventricle:  The cavity size was normal. There was mild concentric hypertrophy. Systolic function was normal. The estimated ejection fraction was in the range of 60% to 65%. Wall motion was normal; there were no regional wall motion abnormalities. Doppler parameters are consistent with abnormal left ventricular relaxation (grade 1 diastolic dysfunction). Doppler parameters are consistent with indeterminate ventricular filling pressure.  ------------------------------------------------------------------- Aortic valve:   Trileaflet; normal thickness leaflets. Mobility was not restricted.  Doppler:  Transvalvular velocity was within the normal range. There was no stenosis. There was no  regurgitation.  ------------------------------------------------------------------- Aorta:  Aortic root: The aortic root was normal in size.  ------------------------------------------------------------------- Mitral valve:   Structurally normal valve.   Mobility was not restricted.  Doppler:  Transvalvular velocity was within the normal range. There was no evidence for stenosis. There was no regurgitation.    Peak gradient (D): 2 mm Hg.  ------------------------------------------------------------------- Left atrium:  The atrium was normal in size.  ------------------------------------------------------------------- Atrial septum:  No defect or patent foramen ovale was identified.  ------------------------------------------------------------------- Right ventricle:  The cavity size was normal. Wall thickness was normal. Systolic function was normal.  ------------------------------------------------------------------- Pulmonic valve:    Structurally normal valve.   Cusp separation was normal.  Doppler:  Transvalvular velocity was within the normal range. There was no evidence for stenosis. There was mild regurgitation.  ------------------------------------------------------------------- Tricuspid valve:   Structurally normal valve.    Doppler: Transvalvular velocity was within the normal range. There was no regurgitation.  ------------------------------------------------------------------- Pulmonary artery:   The main pulmonary artery was normal-sized. Systolic pressure could not be accurately estimated.  ------------------------------------------------------------------- Right atrium:  The atrium was normal in size.  ------------------------------------------------------------------- Pericardium:  There was no pericardial effusion.  ------------------------------------------------------------------- Systemic veins: Inferior vena cava: The vessel was normal in size.  The respirophasic diameter changes were in the normal range (>= 50%), consistent with normal central venous pressure.  ------------------------------------------------------------------- Measurements  Left ventricle                          Value        Reference LV ID, ED, PLAX chordal         (L)     40.6  mm  43 - 52 LV ID, ES, PLAX chordal                 25.2  mm     23 - 38 LV fx shortening, PLAX chordal          38    %      >=29 LV PW thickness, ED                     12.6  mm     ---------- IVS/LV PW ratio, ED                     1.08         <=1.3 Stroke volume, 2D                       73    ml     ---------- Stroke volume/bsa, 2D                   36    ml/m^2 ---------- LV e&', lateral                          5.87  cm/s   ---------- LV E/e&', lateral                        12.91        ---------- LV e&', medial                           5.11  cm/s   ---------- LV E/e&', medial                         14.83        ---------- LV e&', average                          5.49  cm/s   ---------- LV E/e&', average                        13.81        ----------  Ventricular septum                      Value        Reference IVS thickness, ED                       13.6  mm     ----------  LVOT                                    Value        Reference LVOT ID, S                              20    mm     ---------- LVOT area                               3.14  cm^2   ---------- LVOT peak velocity, S  97.9  cm/s   ---------- LVOT mean velocity, S                   66.7  cm/s   ---------- LVOT VTI, S                             23.2  cm     ----------  Aorta                                   Value        Reference Aortic root ID, ED                      25    mm     ----------  Left atrium                             Value        Reference LA ID, A-P, ES                          36    mm     ---------- LA ID/bsa, A-P                          1.77  cm/m^2  <=2.2 LA volume, S                            39.5  ml     ---------- LA volume/bsa, S                        19.4  ml/m^2 ---------- LA volume, ES, 1-p A4C                  38.4  ml     ---------- LA volume/bsa, ES, 1-p A4C              18.8  ml/m^2 ---------- LA volume, ES, 1-p A2C                  40.5  ml     ---------- LA volume/bsa, ES, 1-p A2C              19.9  ml/m^2 ----------  Mitral valve                            Value        Reference Mitral E-wave peak velocity             75.8  cm/s   ---------- Mitral A-wave peak velocity             77.6  cm/s   ---------- Mitral deceleration time        (H)     345   ms     150 - 230 Mitral peak gradient, D                 2     mm Hg  ---------- Mitral E/A ratio, peak                  1            ----------  Right atrium                            Value        Reference RA ID, S-I, ES, A4C                     44.4  mm     34 - 49 RA area, ES, A4C                        13.7  cm^2   8.3 - 19.5 RA volume, ES, A/L                      35.3  ml     ---------- RA volume/bsa, ES, A/L                  17.3  ml/m^2 ----------  Systemic veins                          Value        Reference Estimated CVP                           3     mm Hg  ----------  Right ventricle                         Value        Reference RV ID, minor axis, ED, A4C base         45.3  mm     ---------- TAPSE                                   25.9  mm     ---------- RV s&', lateral, S                       15.5  cm/s   ----------  Legend: (L)  and  (H)  mark values outside specified reference range.  ------------------------------------------------------------------- Prepared and Electronically Authenticated by  Chilton Si, MD 2018-09-14T09:02:12              EKG:  EKG is  ordered today.  The ekg ordered today demonstrates normal sinus rhythm, rate 74 bpm  Recent Labs: No results found for requested labs within last 365 days.  Recent Lipid  Panel    Component Value Date/Time   CHOL 163 07/02/2021 1138   TRIG 317 (H) 07/02/2021 1138   HDL 39 (L) 07/02/2021 1138   CHOLHDL 4.2 07/02/2021 1138   CHOLHDL 5.6 02/26/2017 1156   VLDL 44 (H) 02/26/2017 1156   LDLCALC 73 07/02/2021 1138    Home Medications   Current Meds  Medication Sig   amLODipine (NORVASC) 10 MG tablet Take 1 tablet by mouth once daily   atorvastatin (LIPITOR) 80 MG tablet TAKE 1 TABLET BY MOUTH ONCE DAILY AT 6 PM   benazepril (LOTENSIN) 20 MG tablet Take 1 tablet (20 mg total) by mouth 2 (two) times daily. Please call our office at [phone] to schedule an appt with Dr. Anne Fu for future refills. Thank you 1st attempt.   chlorthalidone (HYGROTON) 25 MG tablet Take 1 tablet (25 mg total) by mouth daily.  Cholecalciferol (VITAMIN D3) 50 MCG (2000 UT) TABS Take 1 tablet by mouth daily.   clopidogrel (PLAVIX) 75 MG tablet Take 1 tablet (75 mg total) by mouth daily. Patient needs appointment for any future refills. Please call office at [phone] to schedule appointment. 1st attempt.   famotidine (PEPCID) 40 MG tablet Take 40 mg by mouth 2 (two) times daily.   gabapentin (NEURONTIN) 300 MG capsule Take 1 tablet twice daily.  OK to take an extra dose as needed for pain.   Garlic 1200 MG CAPS Take 1 tablet by mouth daily.   metFORMIN (GLUCOPHAGE) 500 MG tablet Take 1 tablet (500 mg total) by mouth 2 (two) times daily with a meal.   metoprolol tartrate (LOPRESSOR) 50 MG tablet Take 1 tablet by mouth twice daily   pantoprazole (PROTONIX) 20 MG tablet Take 1 tablet (20 mg total) by mouth daily.   vitamin B-12 (CYANOCOBALAMIN) 500 MCG tablet Take 500 mcg by mouth daily.   vitamin C (ASCORBIC ACID) 500 MG tablet Take 500 mg by mouth daily.     Review of Systems      All other systems reviewed and are otherwise negative except as noted above.  Physical Exam    VS:  BP (!) 155/78   Pulse 74   Ht 5\' 7"  (1.702 m)   Wt 183 lb (83 kg)   BMI 28.66 kg/m  , BMI  Body mass index is 28.66 kg/m.  Wt Readings from Last 3 Encounters:  10/16/22 183 lb (83 kg)  09/22/22 187 lb (84.8 kg)  09/22/21 188 lb (85.3 kg)     GEN: Well nourished, well developed, in no acute distress. HEENT: normal. Neck: Supple, no JVD, carotid bruits, or masses. Cardiac: RRR, no murmurs, rubs, or gallops. No clubbing, cyanosis, 1+ LE edema.  Radials/PT 2+ and equal bilaterally.  Respiratory:  Respirations regular and unlabored, clear to auscultation bilaterally. GI: Soft, nontender, nondistended. MS: No deformity or atrophy. Skin: Warm and dry, no rash. Neuro:  Strength and sensation are intact. Psych: Normal affect.  Assessment & Plan    Hypertension -BP elevated today -Continue current patient regimen which includes amlodipine 10 mg daily, benazepril 20 mg twice a day, chlorthalidone 25 mg daily, Lopressor 50 mg twice a day -Continue to check her blood pressure at home, if needed we can increase his metoprolol to 40 mg twice a day  Hyperlipemia -Recommend updating lipid panel through PCP -For now continue current medication regimen Lipitor 80 mg daily  CAD -no chest pain or SOB today -continue current medications  Quit smoking in 1974 -has not restarted  Stoke 5 years ago  -fatigue and some weakness since stroke -he can't even go for a walk  He did not do any rehab after stroke   HYPERTENSION CONTROL Vitals:   10/16/22 1447 10/16/22 1644  BP: (!) 162/76 (!) 155/78    The patient's blood pressure is elevated above target today.  In order to address the patient's elevated BP: Blood pressure will be monitored at home to determine if medication changes need to be made.         Disposition: Follow up 1 year with Donato Schultz, MD or APP.  Signed, Sharlene Dory, PA-C 10/16/2022, 4:47 PM [facility]

## 2022-10-16 NOTE — Patient Instructions (Addendum)
Medication Instructions:   Your physician recommends that you continue on your current medications as directed. Please refer to the Current Medication list given to you today.  *If you need a refill on your cardiac medications before your next appointment, please call your pharmacy*   Lab Work:  MAKE SURE YOU GET YOUR FASTING  LABS LIVER AND CHOLESTEROL DONE WITH PRIMARY PROVIDER.    If you have labs (blood work) drawn today and your tests are completely normal, you will receive your results only by: MyChart Message (if you have MyChart) OR A paper copy in the mail If you have any lab test that is abnormal or we need to change your treatment, we will call you to review the results.   Testing/Procedures:NONE ORDERED  TODAY     Follow-Up: At [facility], you and your health needs are our priority.  As part of our continuing mission to provide you with exceptional heart care, we have created designated Provider Care Teams.  These Care Teams include your primary Cardiologist (physician) and Advanced Practice Providers (APPs -  Physician Assistants and Nurse Practitioners) who all work together to provide you with the care you need, when you need it.  We recommend signing up for the patient portal called "MyChart".  Sign up information is provided on this After Visit Summary.  MyChart is used to connect with patients for Virtual Visits (Telemedicine).  Patients are able to view lab/test results, encounter notes, upcoming appointments, etc.  Non-urgent messages can be sent to your provider as well.   To learn more about what you can do with MyChart, go to [email or web address].    Your next appointment:   1 year(s)  Provider: SKAINS/  CONTE/TESSA    Other Instructions

## 2022-10-19 ENCOUNTER — Other Ambulatory Visit: Payer: Self-pay

## 2022-10-19 MED ORDER — ATORVASTATIN CALCIUM 80 MG PO TABS: 80.0000 mg | ORAL_TABLET | Freq: Every day | ORAL | 3 refills | Status: DC

## 2022-10-19 MED ORDER — BENAZEPRIL HCL 20 MG PO TABS: 20.0000 mg | ORAL_TABLET | Freq: Two times a day (BID) | ORAL | 3 refills | Status: DC

## 2022-10-19 MED ORDER — METOPROLOL TARTRATE 50 MG PO TABS: 50.0000 mg | ORAL_TABLET | Freq: Two times a day (BID) | ORAL | 3 refills | Status: DC

## 2022-10-19 MED ORDER — PANTOPRAZOLE SODIUM 20 MG PO TBEC: 20.0000 mg | DELAYED_RELEASE_TABLET | Freq: Every day | ORAL | 3 refills | Status: DC

## 2022-10-19 MED ORDER — CHLORTHALIDONE 25 MG PO TABS: 25.0000 mg | ORAL_TABLET | Freq: Every day | ORAL | 3 refills | Status: DC

## 2022-10-19 MED ORDER — CLOPIDOGREL BISULFATE 75 MG PO TABS: 75.0000 mg | ORAL_TABLET | Freq: Every day | ORAL | 3 refills | Status: DC

## 2022-10-19 MED ORDER — AMLODIPINE BESYLATE 10 MG PO TABS: 10.0000 mg | ORAL_TABLET | Freq: Every day | ORAL | 3 refills | Status: DC

## 2023-05-31 ENCOUNTER — Other Ambulatory Visit: Payer: Self-pay | Admitting: Neurology

## 2023-06-04 ENCOUNTER — Other Ambulatory Visit: Payer: Self-pay | Admitting: Neurology

## 2023-06-07 ENCOUNTER — Other Ambulatory Visit: Payer: Self-pay | Admitting: Neurology

## 2023-09-27 ENCOUNTER — Ambulatory Visit (INDEPENDENT_AMBULATORY_CARE_PROVIDER_SITE_OTHER): Payer: Medicare Other | Admitting: Neurology

## 2023-09-27 ENCOUNTER — Encounter: Payer: Self-pay | Admitting: Neurology

## 2023-09-27 VITALS — BP 121/76 | HR 65 | Ht 67.0 in | Wt 182.0 lb

## 2023-09-27 DIAGNOSIS — G500 Trigeminal neuralgia: Secondary | ICD-10-CM | POA: Diagnosis not present

## 2023-09-27 MED ORDER — GABAPENTIN 300 MG PO CAPS: 300.0000 mg | ORAL_CAPSULE | Freq: Two times a day (BID) | ORAL | 3 refills | Status: AC

## 2023-09-27 NOTE — Progress Notes (Signed)
 Follow-up Visit   Date: 09/27/23   James Hall MRN: [id number] DOB: 1946/05/03   Interim History: James Hall is a 78 y.o. right-handed Caucasian male with CAD, hypertension, and hyperlipidemia returning to the clinic for follow-up of right trigeminal neuralgia.  The patient was accompanied to the clinic by self.  IMPRESSION/PLAN: Right trigeminal neuralgia (2022), stable.    - Previously tried carbamazepine (effective, stopped due to rash)  - Continue gabapentin 300mg  BID.  OK to titrate, if needed.  Return to clinic in 1 year  ---------------------------------------------------------------- History of present illness: Starting in June 2022, he began having spells of right facial pain, descrbied as shooting, needle-like pain, starting behind the right eye, which has transitioned to involve the right temple and radiates into the upper jaw/tetth.  Initially, he was afraid to brush his teeth, chew, or even allow water to hit that side of the face.   He was seen in the ER and started on carbamazepine 100mg  BID.  This has reduced the intensity of pain, but not eleiminated it.  He continues to have daily spells of pain. No vision changes or facial weakness.  He has noticed a rash over the legs over the past week which is new.   UPDATE 03/24/2021:  He is here for follow-up visit.  He was switched to gabapentin 300mg  BID due to rash associated with carbamazepine. He is having intermittent spells of pain occurring about once every 2-3 weeks.  His pain is triggered by sleeping on the right side. His rash has cleared up.    UPDATE 09/22/2022:  He is here for 1 year follow-up.  His trigeminal neuralgia was under excellent control until around January/February, when he started having spells of pain again. Pain is very brief lasting only a few second, but does occur daily.  He says that the pain is not as severe as it previously was and he does not want to increase the dose of medications.   Pain does not interfere with chewing, eating, or talking.   UPDATE 09/27/2023:  He is here for 1 year follow-up visit.  His trigeminal neuralgia was under good control but he had a few weeks where his pain intensified, but this improved.  He now has low grade dull pain.  He remains on gabapentin 300mg  twice daily.  No interval falls or hospitalizations.  Overall, he is doing well.  Medications:  Current Outpatient Medications on File Prior to Visit  Medication Sig Dispense Refill   amLODipine (NORVASC) 10 MG tablet Take 1 tablet (10 mg total) by mouth daily. 90 tablet 3   atorvastatin (LIPITOR) 80 MG tablet Take 1 tablet (80 mg total) by mouth daily. 90 tablet 3   benazepril (LOTENSIN) 20 MG tablet Take 1 tablet (20 mg total) by mouth 2 (two) times daily. 180 tablet 3   chlorthalidone (HYGROTON) 25 MG tablet Take 1 tablet (25 mg total) by mouth daily. 90 tablet 3   Cholecalciferol (VITAMIN D3) 50 MCG (2000 UT) TABS Take 1 tablet by mouth daily.     clopidogrel (PLAVIX) 75 MG tablet Take 1 tablet (75 mg total) by mouth daily. 90 tablet 3   dicyclomine (BENTYL) 10 MG capsule Take 10 mg by mouth 2 (two) times daily.     FARXIGA 10 MG TABS tablet Take 10 mg by mouth daily.     gabapentin (NEURONTIN) 300 MG capsule TAKE 1 CAPSULE BY MOUTH TWICE DAILY. OKAY TO TAKE AN EXTRA DOSE AS NEEDED FOR PAIN 200 capsule  0   Garlic 1200 MG CAPS Take 1 tablet by mouth daily.     metFORMIN (GLUCOPHAGE) 500 MG tablet Take 1 tablet (500 mg total) by mouth 2 (two) times daily with a meal. 60 tablet 11   metoprolol tartrate (LOPRESSOR) 50 MG tablet Take 1 tablet (50 mg total) by mouth 2 (two) times daily. 180 tablet 3   pantoprazole (PROTONIX) 20 MG tablet Take 1 tablet (20 mg total) by mouth daily. 90 tablet 3   vitamin B-12 (CYANOCOBALAMIN) 500 MCG tablet Take 500 mcg by mouth daily.     vitamin C (ASCORBIC ACID) 500 MG tablet Take 500 mg by mouth daily.     No current facility-administered medications on file prior  to visit.    Allergies:  Allergies  Allergen Reactions   Carbamazepine Rash   Penicillins Rash    Has patient had a PCN reaction causing immediate rash, facial/tongue/throat swelling, SOB or lightheadedness with hypotension: No Has patient had a PCN reaction causing severe rash involving mucus membranes or skin necrosis: No Has patient had a PCN reaction that required hospitalization: No Has patient had a PCN reaction occurring within the last 10 years: No If all of the above answers are "NO", then may proceed with Cephalosporin use.    Vital Signs:  BP 121/76 (Cuff Size: Normal)   Pulse 65   Ht 5\' 7"  (1.702 m)   Wt 182 lb (82.6 kg)   SpO2 95%   BMI 28.51 kg/m   Neurological Exam: MENTAL STATUS including orientation to time, place, person, recent and remote memory, attention span and concentration, language, and fund of knowledge is normal.  Speech is not dysarthric.  CRANIAL NERVES:   Pupils equal round and reactive to light.  Normal conjugate, extra-ocular eye movements in all directions of gaze.  No ptosis.  Face is symmetric. Facial sensation is normal  MOTOR:  Motor strength is 5/5 in all extremities.  No atrophy, fasciculations or abnormal movements.  No pronator drift.    COORDINATION/GAIT:    Gait narrow based and stable.   Data: n/a    Thank you for allowing me to participate in patient's care.  If I can answer any additional questions, I would be pleased to do so.    Sincerely,    Derold Dorsch K. Lydia Sams, DO

## 2023-09-30 ENCOUNTER — Other Ambulatory Visit: Payer: Self-pay | Admitting: Physician Assistant

## 2023-10-07 ENCOUNTER — Telehealth: Payer: Self-pay | Admitting: Neurology

## 2023-10-07 NOTE — Telephone Encounter (Signed)
 Called patient and left a message for a call back but was unable to leave a message as voicemail is not set up.   Need to verify how patient is taking his Gabapentin .  Per last note: Continue gabapentin  300mg  BID. OK to titrate, if needed.

## 2023-10-07 NOTE — Telephone Encounter (Signed)
 Left message with the after hours service on 10-07-23 at 12:46 pm  Caller states that doctor called in the Gabapentin  but the pharmacy is stating it is too early to fill so they did not fill it

## 2023-10-28 ENCOUNTER — Telehealth: Payer: Self-pay | Admitting: Cardiology

## 2023-10-28 MED ORDER — CHLORTHALIDONE 25 MG PO TABS: 25.0000 mg | ORAL_TABLET | Freq: Every day | ORAL | 0 refills | Status: DC

## 2023-10-28 MED ORDER — BENAZEPRIL HCL 20 MG PO TABS: 20.0000 mg | ORAL_TABLET | Freq: Two times a day (BID) | ORAL | 0 refills | Status: DC

## 2023-10-28 MED ORDER — METOPROLOL TARTRATE 50 MG PO TABS: 50.0000 mg | ORAL_TABLET | Freq: Two times a day (BID) | ORAL | 0 refills | Status: DC

## 2023-10-28 NOTE — Telephone Encounter (Signed)
 Pt's medications were sent to pt's pharmacy as requested. Confirmation received.

## 2023-10-28 NOTE — Telephone Encounter (Signed)
*  STAT* If patient is at the pharmacy, call can be transferred to refill team.   1. Which medications need to be refilled? (please list name of each medication and dose if known)  benazepril  (LOTENSIN ) 20 MG tablet   chlorthalidone  (HYGROTON ) 25 MG tablet  metoprolol  tartrate (LOPRESSOR ) 50 MG tablet  2. Which pharmacy/location (including street and city if local pharmacy) is medication to be sent to? [facility] [address], [place] - [address] [place] Phone: [phone]  Fax: [phone]     3. Do they need a 30 day or 90 day supply? Pt has made appt for 7/ 17 please refill til then

## 2023-12-28 NOTE — Progress Notes (Unsigned)
 Office Visit    Patient Name: James Hall Date of Encounter: 12/30/2023  PCP:  Darra Angeline BROCKS, FNP   [facility]  Cardiologist:  Oneil Parchment, MD  Advanced Practice Provider:  No care team member to display Electrophysiologist:  None   HPI    James Hall is a 78 y.o. male with a past medical history of coronary artery disease with prior stenting placed in 2007, hypertension, hyperlipidemia, smoking cessation back in 1974 presents today for overdue follow-up appointment.  History of prior hypertensive emergency in 2018.  Last seen 07/02/2021 and was overall doing well.  No chest pain, fevers, chills, nausea, vomiting, syncope, or bleeding.  He was taking an antidiarrheal pill daily.  No recent lipid panel at that time.  He was seen by me in May 2024, he is doing well from a cardiovascular standpoint.  Blood pressure is slightly elevated.  We have asked him to monitor this at home.  He is compliant with all medications.  He is taking for blood pressure pills.  He tells me that he had a stroke 5 years ago and did not do any PT/OT/ST.  He has had fatigue and decrease in stamina since that time.  Reports no shortness of breath nor dyspnea on exertion. Reports no chest pain, pressure, or tightness. No edema, orthopnea, PND. Reports no palpitations.   Today, he has a history of stroke who presents with fatigue and shortness of breath.  He has experienced persistent fatigue and shortness of breath since his stroke six years ago, with no significant changes in the past year. Fatigue occurs with walking around the house, limiting activities like hiking. He did not undergo rehabilitation post-stroke. His last heart function evaluation was in 2018. Current medications include amlodipine , benazepril , chlorthalidone , Plavix , metoprolol , and Protonix , with no side effects. No chest pain. Occasional swelling in one ankle over the past year. He quit smoking in  1974.  Reports no chest pain, pressure, or tightness. No edema, orthopnea, PND. Reports no palpitations.   Discussed the use of AI scribe software for clinical note transcription with the patient, who gave verbal consent to proceed.   Past Medical History    Past Medical History:  Diagnosis Date   Atherosclerotic heart disease of native coronary artery without angina pectoris    Coronary arteriosclerosis due to lipid rich plaque    Hypertension    Mixed hyperlipidemia    Past Surgical History:  Procedure Laterality Date   Coronary  STENT  2007    Allergies  Allergies  Allergen Reactions   Carbamazepine  Rash   Penicillins Rash    Has patient had a PCN reaction causing immediate rash, facial/tongue/throat swelling, SOB or lightheadedness with hypotension: No Has patient had a PCN reaction causing severe rash involving mucus membranes or skin necrosis: No Has patient had a PCN reaction that required hospitalization: No Has patient had a PCN reaction occurring within the last 10 years: No If all of the above answers are NO, then may proceed with Cephalosporin use.    EKGs/Labs/Other Studies Reviewed:   The following studies were reviewed today: Cardiac Studies & Procedures   ______________________________________________________________________________________________   STRESS TESTS  MYOCARDIAL PERFUSION IMAGING 12/11/2009   ECHOCARDIOGRAM  ECHOCARDIOGRAM COMPLETE 02/26/2017  Narrative *[place]* *[facility]* [address] [place], [place] [address] [phone]  ------------------------------------------------------------------- Transthoracic Echocardiography  Patient:    James, Hall MR #:       [id number] Study Date: 02/26/2017 Gender:  M Age:        19 Height:     170.2 cm Weight:     85.9 kg BSA:        2.04 m^2 Pt. Status: Room:       2M12C  ATTENDING    Patsey Rankin SAUNDERS. PERFORMING   [facility], Inpatient ORDERING      Rosan Deward ORN ADMITTING    Mannam, Praveen SONOGRAPHER  Fairy Canton, RDCS  cc:  ------------------------------------------------------------------- LV EF: 60% -   65%  ------------------------------------------------------------------- History:   PMH:  hypertensive crisis.  Coronary artery disease. Risk factors:  Hypertension. Dyslipidemia.  ------------------------------------------------------------------- Study Conclusions  - Left ventricle: The cavity size was normal. There was mild concentric hypertrophy. Systolic function was normal. The estimated ejection fraction was in the range of 60% to 65%. Wall motion was normal; there were no regional wall motion abnormalities. Doppler parameters are consistent with abnormal left ventricular relaxation (grade 1 diastolic dysfunction). Doppler parameters are consistent with indeterminate ventricular filling pressure. - Aortic valve: Transvalvular velocity was within the normal range. There was no stenosis. There was no regurgitation. - Mitral valve: Transvalvular velocity was within the normal range. There was no evidence for stenosis. There was no regurgitation. - Right ventricle: The cavity size was normal. Wall thickness was normal. Systolic function was normal. - Atrial septum: No defect or patent foramen ovale was identified. - Tricuspid valve: There was no regurgitation.  ------------------------------------------------------------------- Study data:  Comparison was made to the study of 12/21/2015.  Study status:  Routine.  Procedure:  Transthoracic echocardiography. Image quality was adequate.  Study completion:  There were no complications.          Transthoracic echocardiography.  M-mode, complete 2D, spectral Doppler, and color Doppler.  Birthdate: Patient birthdate: [birth year].  Age:  Patient is 78 yr old.  Sex: Gender: male.    BMI: 29.7 kg/m^2.  Blood pressure:     166/72 Patient status:  Inpatient.  Study date:   Study date: 02/26/2017. Study time: 07:50 AM.  Location:  Bedside.  -------------------------------------------------------------------  ------------------------------------------------------------------- Left ventricle:  The cavity size was normal. There was mild concentric hypertrophy. Systolic function was normal. The estimated ejection fraction was in the range of 60% to 65%. Wall motion was normal; there were no regional wall motion abnormalities. Doppler parameters are consistent with abnormal left ventricular relaxation (grade 1 diastolic dysfunction). Doppler parameters are consistent with indeterminate ventricular filling pressure.  ------------------------------------------------------------------- Aortic valve:   Trileaflet; normal thickness leaflets. Mobility was not restricted.  Doppler:  Transvalvular velocity was within the normal range. There was no stenosis. There was no regurgitation.  ------------------------------------------------------------------- Aorta:  Aortic root: The aortic root was normal in size.  ------------------------------------------------------------------- Mitral valve:   Structurally normal valve.   Mobility was not restricted.  Doppler:  Transvalvular velocity was within the normal range. There was no evidence for stenosis. There was no regurgitation.    Peak gradient (D): 2 mm Hg.  ------------------------------------------------------------------- Left atrium:  The atrium was normal in size.  ------------------------------------------------------------------- Atrial septum:  No defect or patent foramen ovale was identified.  ------------------------------------------------------------------- Right ventricle:  The cavity size was normal. Wall thickness was normal. Systolic function was normal.  ------------------------------------------------------------------- Pulmonic valve:    Structurally normal valve.   Cusp separation was normal.   Doppler:  Transvalvular velocity was within the normal range. There was no evidence for stenosis. There was mild regurgitation.  ------------------------------------------------------------------- Tricuspid valve:   Structurally normal valve.    Doppler: Transvalvular velocity  was within the normal range. There was no regurgitation.  ------------------------------------------------------------------- Pulmonary artery:   The main pulmonary artery was normal-sized. Systolic pressure could not be accurately estimated.  ------------------------------------------------------------------- Right atrium:  The atrium was normal in size.  ------------------------------------------------------------------- Pericardium:  There was no pericardial effusion.  ------------------------------------------------------------------- Systemic veins: Inferior vena cava: The vessel was normal in size. The respirophasic diameter changes were in the normal range (>= 50%), consistent with normal central venous pressure.  ------------------------------------------------------------------- Measurements  Left ventricle                          Value        Reference LV ID, ED, PLAX chordal         (L)     40.6  mm     43 - 52 LV ID, ES, PLAX chordal                 25.2  mm     23 - 38 LV fx shortening, PLAX chordal          38    %      >=29 LV PW thickness, ED                     12.6  mm     ---------- IVS/LV PW ratio, ED                     1.08         <=1.3 Stroke volume, 2D                       73    ml     ---------- Stroke volume/bsa, 2D                   36    ml/m^2 ---------- LV e&', lateral                          5.87  cm/s   ---------- LV E/e&', lateral                        12.91        ---------- LV e&', medial                           5.11  cm/s   ---------- LV E/e&', medial                         14.83        ---------- LV e&', average                          5.49  cm/s    ---------- LV E/e&', average                        13.81        ----------  Ventricular septum                      Value        Reference IVS thickness, ED                       13.6  mm     ----------  LVOT                                    Value        Reference LVOT ID, S                              20    mm     ---------- LVOT area                               3.14  cm^2   ---------- LVOT peak velocity, S                   97.9  cm/s   ---------- LVOT mean velocity, S                   66.7  cm/s   ---------- LVOT VTI, S                             23.2  cm     ----------  Aorta                                   Value        Reference Aortic root ID, ED                      25    mm     ----------  Left atrium                             Value        Reference LA ID, A-P, ES                          36    mm     ---------- LA ID/bsa, A-P                          1.77  cm/m^2 <=2.2 LA volume, S                            39.5  ml     ---------- LA volume/bsa, S                        19.4  ml/m^2 ---------- LA volume, ES, 1-p A4C                  38.4  ml     ---------- LA volume/bsa, ES, 1-p A4C              18.8  ml/m^2 ---------- LA volume, ES, 1-p A2C                  40.5  ml     ---------- LA volume/bsa, ES, 1-p A2C              19.9  ml/m^2 ----------  Mitral valve  Value        Reference Mitral E-wave peak velocity             75.8  cm/s   ---------- Mitral A-wave peak velocity             77.6  cm/s   ---------- Mitral deceleration time        (H)     345   ms     150 - 230 Mitral peak gradient, D                 2     mm Hg  ---------- Mitral E/A ratio, peak                  1            ----------  Right atrium                            Value        Reference RA ID, S-I, ES, A4C                     44.4  mm     34 - 49 RA area, ES, A4C                        13.7  cm^2   8.3 - 19.5 RA volume, ES, A/L                      35.3  ml      ---------- RA volume/bsa, ES, A/L                  17.3  ml/m^2 ----------  Systemic veins                          Value        Reference Estimated CVP                           3     mm Hg  ----------  Right ventricle                         Value        Reference RV ID, minor axis, ED, A4C base         45.3  mm     ---------- TAPSE                                   25.9  mm     ---------- RV s&', lateral, S                       15.5  cm/s   ----------  Legend: (L)  and  (H)  mark values outside specified reference range.  ------------------------------------------------------------------- Prepared and Electronically Authenticated by  Annabella Scarce, MD 2018-09-14T09:02:12          ______________________________________________________________________________________________       EKG:  EKG is  ordered today.  The ekg ordered today demonstrates normal sinus rhythm, rate 74 bpm  Recent Labs: No results found for requested labs within last 365 days.  Recent Lipid Panel    Component  Value Date/Time   CHOL 163 07/02/2021 1138   TRIG 317 (H) 07/02/2021 1138   HDL 39 (L) 07/02/2021 1138   CHOLHDL 4.2 07/02/2021 1138   CHOLHDL 5.6 02/26/2017 1156   VLDL 44 (H) 02/26/2017 1156   LDLCALC 73 07/02/2021 1138    Home Medications   Current Meds  Medication Sig   Cholecalciferol (VITAMIN D3) 50 MCG (2000 UT) TABS Take 1 tablet by mouth daily.   dicyclomine (BENTYL) 10 MG capsule Take 10 mg by mouth 2 (two) times daily.   FARXIGA 10 MG TABS tablet Take 10 mg by mouth daily.   gabapentin  (NEURONTIN ) 300 MG capsule Take 1 capsule (300 mg total) by mouth 2 (two) times daily.   Garlic 1200 MG CAPS Take 1 tablet by mouth daily.   metFORMIN  (GLUCOPHAGE ) 500 MG tablet Take 1 tablet (500 mg total) by mouth 2 (two) times daily with a meal.   vitamin B-12 (CYANOCOBALAMIN) 500 MCG tablet Take 500 mcg by mouth daily.   vitamin C (ASCORBIC ACID) 500 MG tablet Take 500 mg by mouth  daily.   [DISCONTINUED] amLODipine  (NORVASC ) 10 MG tablet Take 1 tablet (10 mg total) by mouth daily.   [DISCONTINUED] atorvastatin  (LIPITOR ) 80 MG tablet Take 1 tablet (80 mg total) by mouth daily.   [DISCONTINUED] benazepril  (LOTENSIN ) 20 MG tablet Take 1 tablet (20 mg total) by mouth 2 (two) times daily.   [DISCONTINUED] chlorthalidone  (HYGROTON ) 25 MG tablet Take 1 tablet (25 mg total) by mouth daily.   [DISCONTINUED] clopidogrel  (PLAVIX ) 75 MG tablet Take 1 tablet (75 mg total) by mouth daily.   [DISCONTINUED] metoprolol  tartrate (LOPRESSOR ) 50 MG tablet Take 1 tablet (50 mg total) by mouth 2 (two) times daily.   [DISCONTINUED] pantoprazole  (PROTONIX ) 20 MG tablet Take 1 tablet by mouth once daily     Review of Systems      All other systems reviewed and are otherwise negative except as noted above.  Physical Exam    VS:  BP (!) 118/56   Pulse 60   Ht 5' 7 (1.702 m)   Wt 178 lb 9.6 oz (81 kg)   SpO2 96%   BMI 27.97 kg/m  , BMI Body mass index is 27.97 kg/m.  Wt Readings from Last 3 Encounters:  12/30/23 178 lb 9.6 oz (81 kg)  09/27/23 182 lb (82.6 kg)  10/16/22 183 lb (83 kg)     GEN: Well nourished, well developed, in no acute distress. HEENT: normal. Neck: Supple, no JVD, carotid bruits, or masses. Cardiac: RRR, no murmurs, rubs, or gallops. No clubbing, cyanosis, 1+ LE edema.  Radials/PT 2+ and equal bilaterally.  Respiratory:  Respirations regular and unlabored, clear to auscultation bilaterally. GI: Soft, nontender, nondistended. MS: No deformity or atrophy. Skin: Warm and dry, no rash. Neuro:  Strength and sensation are intact. Psych: Normal affect.  Assessment & Plan    Shortness of breath Intermittent shortness of breath without worsening. No chest pain. Previous normal heart function in 2018. - Schedule echocardiogram to assess cardiac contribution.  LE edema Intermittent ankle swelling, none today on exam  Stroke Stroke six years ago with persistent  fatigue and weakness. No rehabilitation. Fatigue may relate to stroke or cardiac issues. - Schedule echocardiogram to assess heart function. - Consider echocardiogram every five years to monitor heart function.  Hypertension -Bp well controlled today -Continue current patient regimen which includes amlodipine  10 mg daily, benazepril  20 mg twice a day, chlorthalidone  25 mg daily, Lopressor  50 mg twice  a day  Hyperlipemia -Recommend updating lipid panel through PCP -For now continue current medication regimen Lipitor  80 mg daily  CAD -no chest pain or SOB today -continue current medications         Disposition: Follow up 1 year with Oneil Parchment, MD or APP.  Signed, Orren LOISE Fabry, PA-C 12/30/2023, 5:01 PM [facility]

## 2023-12-30 ENCOUNTER — Encounter: Payer: Self-pay | Admitting: Physician Assistant

## 2023-12-30 ENCOUNTER — Ambulatory Visit: Attending: Physician Assistant | Admitting: Physician Assistant

## 2023-12-30 VITALS — BP 118/56 | HR 60 | Ht 67.0 in | Wt 178.6 lb

## 2023-12-30 DIAGNOSIS — I2510 Atherosclerotic heart disease of native coronary artery without angina pectoris: Secondary | ICD-10-CM

## 2023-12-30 DIAGNOSIS — E785 Hyperlipidemia, unspecified: Secondary | ICD-10-CM | POA: Diagnosis not present

## 2023-12-30 DIAGNOSIS — I639 Cerebral infarction, unspecified: Secondary | ICD-10-CM

## 2023-12-30 DIAGNOSIS — I10 Essential (primary) hypertension: Secondary | ICD-10-CM

## 2023-12-30 MED ORDER — PANTOPRAZOLE SODIUM 20 MG PO TBEC: 20.0000 mg | DELAYED_RELEASE_TABLET | Freq: Every day | ORAL | 3 refills | Status: AC

## 2023-12-30 MED ORDER — CLOPIDOGREL BISULFATE 75 MG PO TABS: 75.0000 mg | ORAL_TABLET | Freq: Every day | ORAL | 3 refills | Status: AC

## 2023-12-30 MED ORDER — CHLORTHALIDONE 25 MG PO TABS: 25.0000 mg | ORAL_TABLET | Freq: Every day | ORAL | 3 refills | Status: AC

## 2023-12-30 MED ORDER — ATORVASTATIN CALCIUM 80 MG PO TABS: 80.0000 mg | ORAL_TABLET | Freq: Every day | ORAL | 3 refills | Status: AC

## 2023-12-30 MED ORDER — BENAZEPRIL HCL 20 MG PO TABS: 20.0000 mg | ORAL_TABLET | Freq: Two times a day (BID) | ORAL | 3 refills | Status: AC

## 2023-12-30 MED ORDER — METOPROLOL TARTRATE 50 MG PO TABS
50.0000 mg | ORAL_TABLET | Freq: Two times a day (BID) | ORAL | 3 refills | Status: AC
Start: 2023-12-30 — End: ?

## 2023-12-30 MED ORDER — AMLODIPINE BESYLATE 10 MG PO TABS: 10.0000 mg | ORAL_TABLET | Freq: Every day | ORAL | 3 refills | Status: AC

## 2023-12-30 NOTE — Patient Instructions (Signed)
 Medication Instructions:  Your physician recommends that you continue on your current medications as directed. Please refer to the Current Medication list given to you today. *If you need a refill on your cardiac medications before your next appointment, please call your pharmacy*  Lab Work: None ordered If you have labs (blood work) drawn today and your tests are completely normal, you will receive your results only by: MyChart Message (if you have MyChart) OR A paper copy in the mail If you have any lab test that is abnormal or we need to change your treatment, we will call you to review the results.  Testing/Procedures: None ordered  Follow-Up: At [facility], you and your health needs are our priority.  As part of our continuing mission to provide you with exceptional heart care, our providers are all part of one team.  This team includes your primary Cardiologist (physician) and Advanced Practice Providers or APPs (Physician Assistants and Nurse Practitioners) who all work together to provide you with the care you need, when you need it.  Your next appointment:   12 month(s)  Provider:   Oneil Parchment, MD    We recommend signing up for the patient portal called MyChart.  Sign up information is provided on this After Visit Summary.  MyChart is used to connect with patients for Virtual Visits (Telemedicine).  Patients are able to view lab/test results, encounter notes, upcoming appointments, etc.  Non-urgent messages can be sent to your provider as well.   To learn more about what you can do with MyChart, go to [email or web address].   Other Instructions

## 2024-01-12 ENCOUNTER — Encounter (HOSPITAL_COMMUNITY): Payer: Self-pay | Admitting: Physician Assistant

## 2024-02-29 ENCOUNTER — Ambulatory Visit (HOSPITAL_COMMUNITY)
Admission: RE | Admit: 2024-02-29 | Discharge: 2024-02-29 | Disposition: A | Discharge status: Home / Self Care | Source: Ambulatory Visit | Attending: Cardiovascular Disease | Admitting: Cardiovascular Disease

## 2024-02-29 DIAGNOSIS — R0602 Shortness of breath: Secondary | ICD-10-CM | POA: Diagnosis present

## 2024-02-29 DIAGNOSIS — I2510 Atherosclerotic heart disease of native coronary artery without angina pectoris: Secondary | ICD-10-CM | POA: Diagnosis present

## 2024-02-29 LAB — ECHOCARDIOGRAM COMPLETE
Area-P 1/2: 2.73 cm2
S' Lateral: 2.9 cm

## 2024-02-29 MED ORDER — PERFLUTREN LIPID MICROSPHERE
2.0000 mL | INTRAVENOUS | Status: AC | PRN
  Administered 2024-02-29: 2 mL via INTRAVENOUS

## 2024-03-02 ENCOUNTER — Ambulatory Visit: Payer: Self-pay | Admitting: Physician Assistant

## 2024-10-02 ENCOUNTER — Ambulatory Visit: Admitting: Neurology
